# Patient Record
Sex: Female | Born: 1975 | Race: White | Hispanic: No | Marital: Married | Smoking: Never smoker
Health system: Southern US, Community
[De-identification: ages and names within clinical notes are randomized; demographics above are authoritative.]

## PROBLEM LIST (undated history)

## (undated) HISTORY — PX: ABDOMINAL HYSTERECTOMY: SHX81

## (undated) HISTORY — PX: URETHERAL RE-IMPLANTATION: SHX2616

## (undated) HISTORY — PX: KNEE SURGERY: SHX244

---

## 1997-11-23 ENCOUNTER — Encounter: Admission: RE | Admit: 1997-11-23 | Discharge: 1998-02-21 | Payer: Self-pay | Admitting: Gynecology

## 1998-04-05 ENCOUNTER — Encounter: Admission: RE | Admit: 1998-04-05 | Discharge: 1998-07-04 | Payer: Self-pay | Admitting: Gynecology

## 1998-06-13 ENCOUNTER — Inpatient Hospital Stay (HOSPITAL_COMMUNITY): Admission: AD | Admit: 1998-06-13 | Discharge: 1998-06-16 | Payer: Self-pay | Admitting: Gynecology

## 2000-03-27 ENCOUNTER — Encounter: Admission: RE | Admit: 2000-03-27 | Discharge: 2000-06-25 | Payer: Self-pay | Admitting: Gynecology

## 2000-05-29 ENCOUNTER — Inpatient Hospital Stay (HOSPITAL_COMMUNITY): Admission: AD | Admit: 2000-05-29 | Discharge: 2000-05-31 | Payer: Self-pay | Admitting: *Deleted

## 2000-11-20 ENCOUNTER — Encounter: Payer: Self-pay | Admitting: Emergency Medicine

## 2000-11-20 ENCOUNTER — Encounter: Admission: RE | Admit: 2000-11-20 | Discharge: 2000-11-20 | Payer: Self-pay | Admitting: Emergency Medicine

## 2002-06-15 ENCOUNTER — Other Ambulatory Visit: Admission: RE | Admit: 2002-06-15 | Discharge: 2002-06-15 | Payer: Self-pay | Admitting: Gynecology

## 2002-12-12 ENCOUNTER — Observation Stay (HOSPITAL_COMMUNITY): Admission: AD | Admit: 2002-12-12 | Discharge: 2002-12-13 | Payer: Self-pay | Admitting: Gynecology

## 2002-12-12 ENCOUNTER — Encounter: Payer: Self-pay | Admitting: Gynecology

## 2003-01-04 ENCOUNTER — Observation Stay (HOSPITAL_COMMUNITY): Admission: AD | Admit: 2003-01-04 | Discharge: 2003-01-05 | Payer: Self-pay | Admitting: Gynecology

## 2003-01-24 ENCOUNTER — Inpatient Hospital Stay (HOSPITAL_COMMUNITY): Admission: AD | Admit: 2003-01-24 | Discharge: 2003-01-26 | Payer: Self-pay | Admitting: Gynecology

## 2003-03-17 ENCOUNTER — Other Ambulatory Visit: Admission: RE | Admit: 2003-03-17 | Discharge: 2003-03-17 | Payer: Self-pay | Admitting: Gynecology

## 2004-12-13 ENCOUNTER — Other Ambulatory Visit: Admission: RE | Admit: 2004-12-13 | Discharge: 2004-12-13 | Payer: Self-pay | Admitting: Gynecology

## 2005-01-02 ENCOUNTER — Encounter: Admission: RE | Admit: 2005-01-02 | Discharge: 2005-04-02 | Payer: Self-pay | Admitting: Gynecology

## 2006-02-05 ENCOUNTER — Other Ambulatory Visit: Admission: RE | Admit: 2006-02-05 | Discharge: 2006-02-05 | Payer: Self-pay | Admitting: Gynecology

## 2006-02-14 ENCOUNTER — Encounter (INDEPENDENT_AMBULATORY_CARE_PROVIDER_SITE_OTHER): Payer: Self-pay | Admitting: Specialist

## 2006-02-14 ENCOUNTER — Inpatient Hospital Stay (HOSPITAL_COMMUNITY): Admission: RE | Admit: 2006-02-14 | Discharge: 2006-02-18 | Payer: Self-pay | Admitting: Gynecology

## 2006-02-17 ENCOUNTER — Encounter: Payer: Self-pay | Admitting: Urology

## 2006-02-21 ENCOUNTER — Ambulatory Visit (HOSPITAL_COMMUNITY): Admission: RE | Admit: 2006-02-21 | Discharge: 2006-02-21 | Payer: Self-pay | Admitting: Urology

## 2006-03-06 ENCOUNTER — Ambulatory Visit (HOSPITAL_COMMUNITY): Admission: RE | Admit: 2006-03-06 | Discharge: 2006-03-06 | Payer: Self-pay | Admitting: Urology

## 2006-04-03 ENCOUNTER — Inpatient Hospital Stay (HOSPITAL_COMMUNITY): Admission: RE | Admit: 2006-04-03 | Discharge: 2006-04-08 | Payer: Self-pay | Admitting: Urology

## 2006-04-03 ENCOUNTER — Encounter (INDEPENDENT_AMBULATORY_CARE_PROVIDER_SITE_OTHER): Payer: Self-pay | Admitting: Specialist

## 2006-10-17 ENCOUNTER — Ambulatory Visit (HOSPITAL_COMMUNITY): Admission: RE | Admit: 2006-10-17 | Discharge: 2006-10-17 | Payer: Self-pay | Admitting: Urology

## 2007-01-02 ENCOUNTER — Encounter: Admission: RE | Admit: 2007-01-02 | Discharge: 2007-01-02 | Payer: Self-pay | Admitting: Gynecology

## 2007-12-26 IMAGING — CT CT PELVIS W/ CM
1 of 4 series · 12 of 32 positions shown, 18 images · IV contrast (30ML OMNI-MIX & [ID] OMNIP 300%)
Comparison: none

CLINICAL DATA: Status post vaginal hysterectomy.  Right ovary and right fallopian tube were removed.  The patient now has fever.  Evaluate for abscess.
 ABDOMEN CT WITH CONTRAST:
TECHNIQUE: Multidetector CT imaging of the abdomen was performed following the standard protocol during bolus administration of intravenous contrast.
 Contrast:  100 cc Omnipaque 300.
TECHNIQUE: Multidetector CT imaging of the pelvis was performed following the standard protocol during bolus administration of intravenous contrast.

[Series 2: abd pelvis · axial · 0.68mm/px · z∈[-420,-40]mm · 12 of 90 slices shown, 18 images]
[im 7/90  soft-tissue]
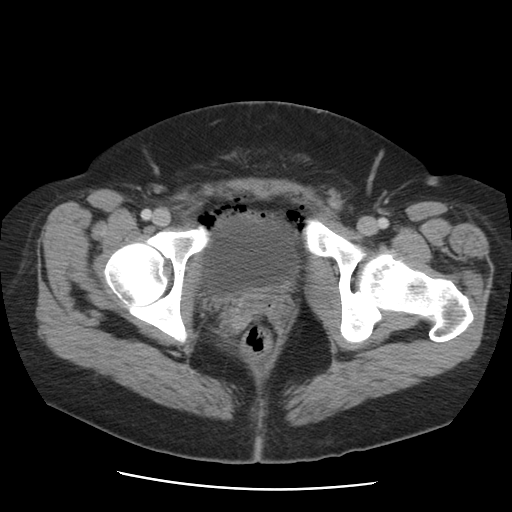
[im 7/90  bone]
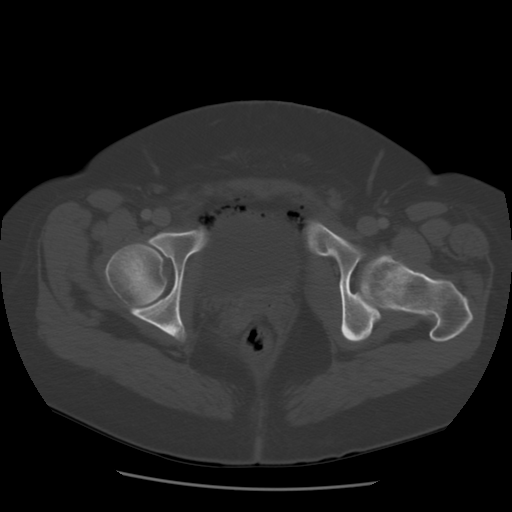
[im 14/90  soft-tissue]
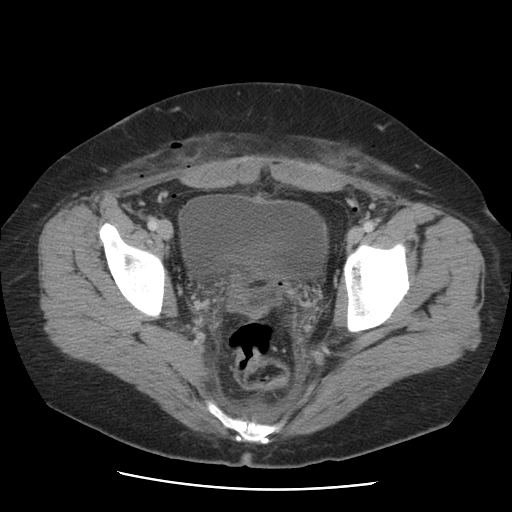
[im 21/90  soft-tissue]
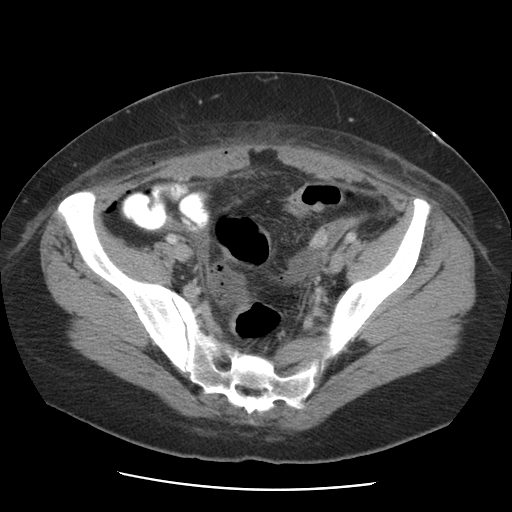
[im 28/90  soft-tissue]
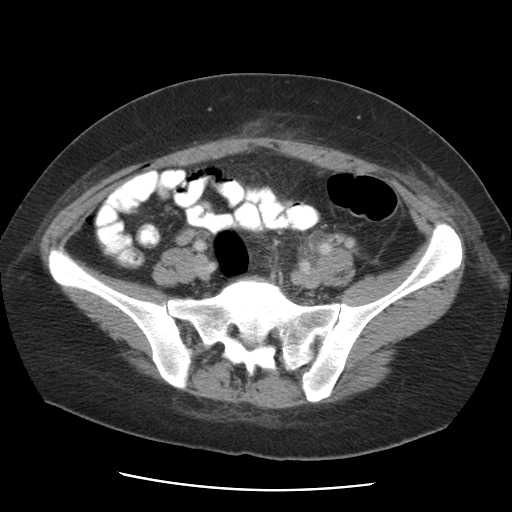
[im 35/90  soft-tissue]
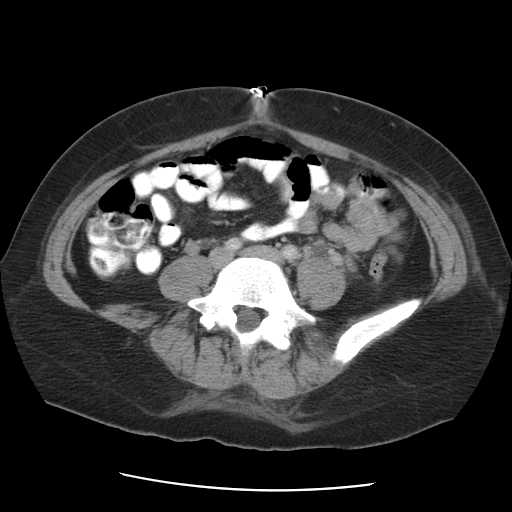
[im 42/90  soft-tissue]
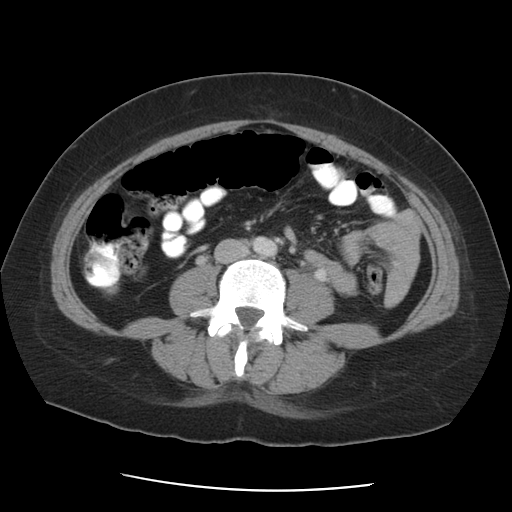
[im 48/90  soft-tissue]
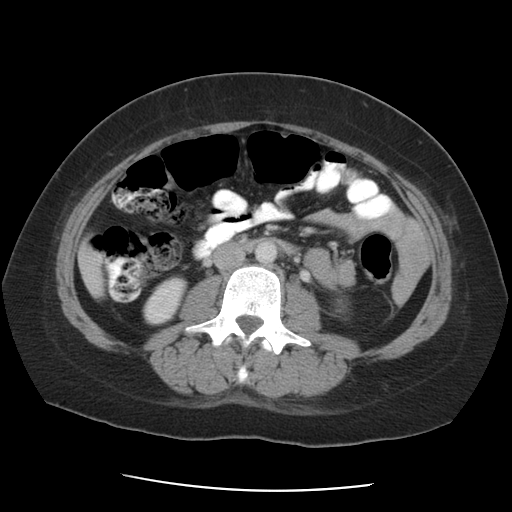
[im 55/90  soft-tissue]
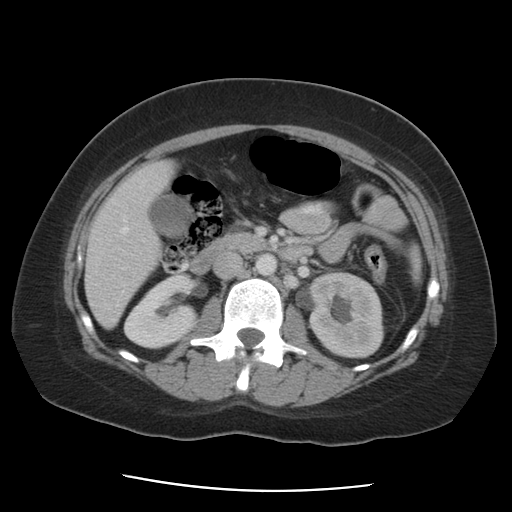
[im 62/90  soft-tissue]
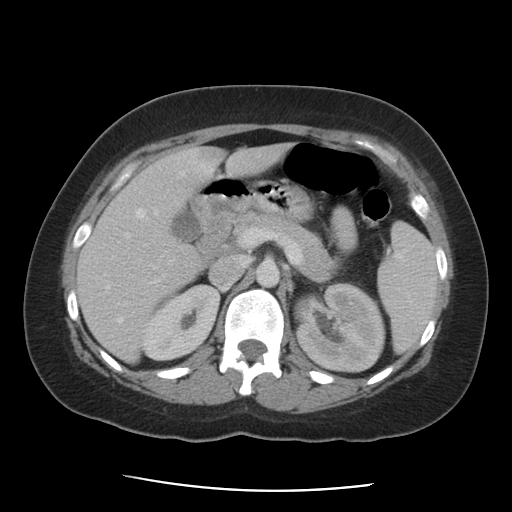
[im 62/90  lung]
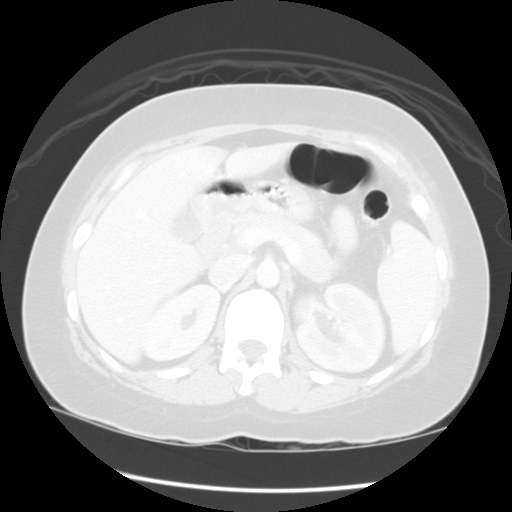
[im 62/90  bone]
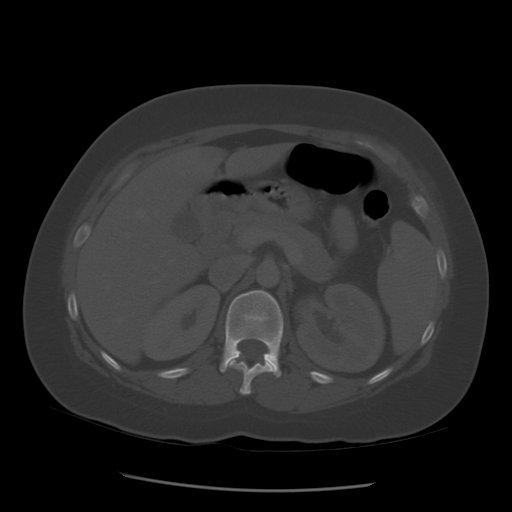
[im 69/90  soft-tissue]
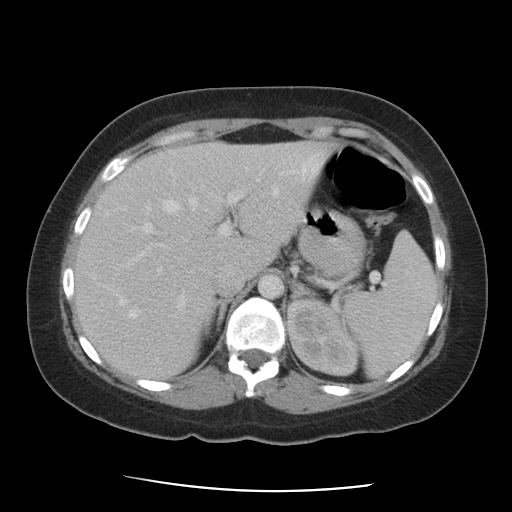
[im 69/90  lung]
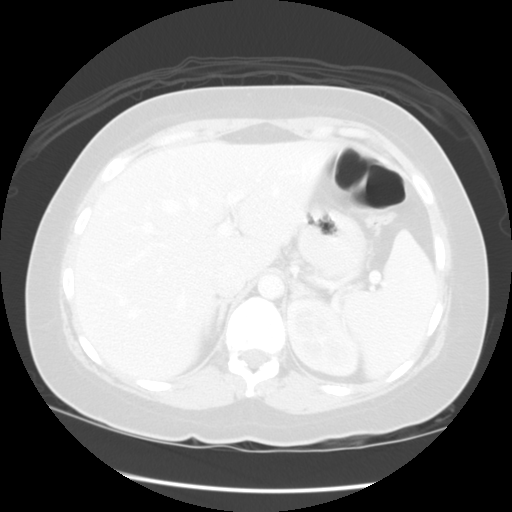
[im 76/90  soft-tissue]
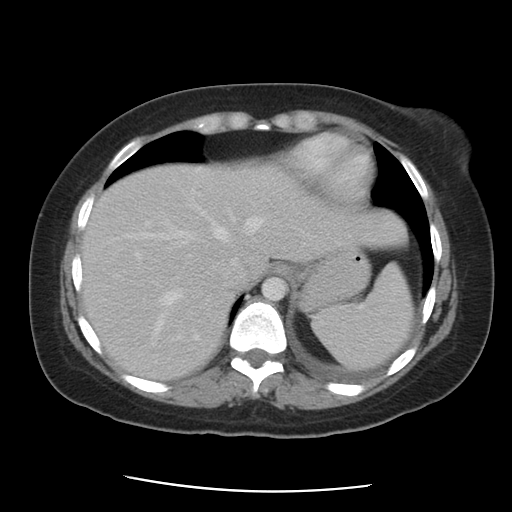
[im 76/90  lung]
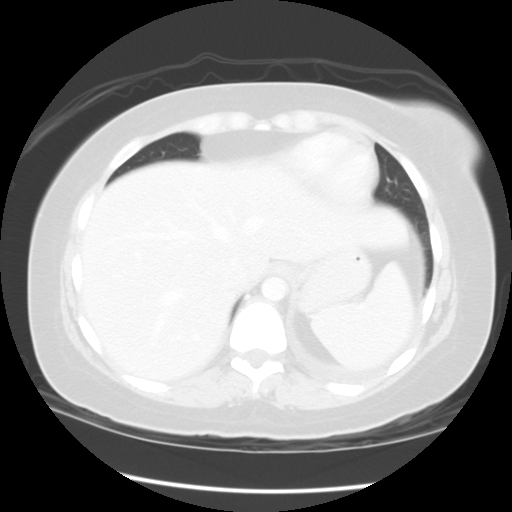
[im 83/90  soft-tissue]
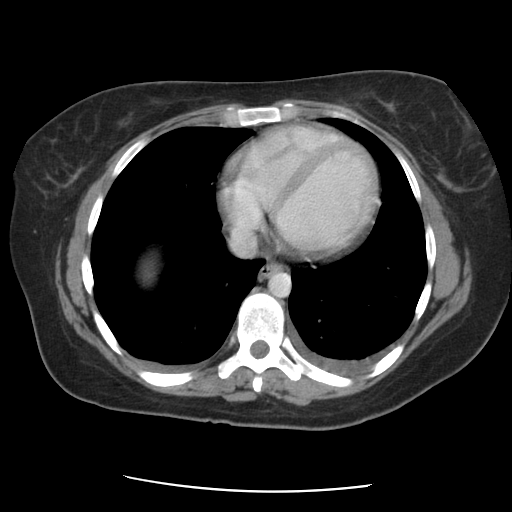
[im 83/90  lung]
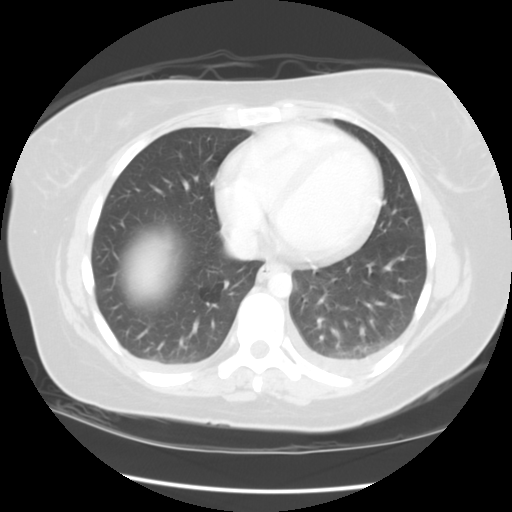

[12 of 32 positions shown; findings below may reference images not displayed]

FINDINGS: Bilateral pleural effusions are noted.  
 The liver is normal in attenuation and morphology.
 The spleen is negative. 
 The adrenal glands are negative. 
 The right kidney is normal.  There is delayed enhancement of the left kidney with moderate hydronephrosis and hydroureter.  On delayed images, there is no radiotracer excretion within the left kidney.  
 There is foci of air within the lower abdominal peritoneal cavity and within the anterior abdominal wall consistent with recent surgery.  
 Review of the bone windows is unremarkable.
IMPRESSION: 1.  Asymmetric delayed enhancement of the left kidney and hydronephrosis.  Findings are concerning for left renal obstruction.
 2.  Bilateral pleural effusions. 
 PELVIS CT WITH CONTRAST:
FINDINGS: Postoperative changes from vaginal hysterectomy are identified.  There is a nonspecific fluid collection anterior to the rectum measuring 7.1 x 1.9 cm (image #72).  This may represent a postoperative abscess.  Hematoma or seroma may have a similar appearance.  Speckled foci of gas favors abscess formation.
 The urinary bladder is negative.  There is a small amount of free fluid within the pelvis. 
 There is hydroureter on the left which extends into the operative bed.  The fluid collection may be obstructing the left ureter.
IMPRESSION: 1.  Nonspecific fluid collection containing speckled areas of gas within the operative bed which may represent abscess.  Hematoma or seroma can have a similar appearance. 
 2.  Left-sided hydroureter which is concerning for left renal obstruction.

## 2007-12-31 IMAGING — XA IR BILIARY CATHETER EXCHANGE
1 series · 5 of 5 positions shown · IV contrast (omnipaque)
Comparison: none

CLINICAL DATA: The patient is status post hysterectomy on 02/14/06.  Percutaneous nephrostomy tube was then placed on 02/17/06 for obstruction of the left kidney and ureter.  The patient now presents for further assessment and possible placement of antegrade ureteral stent. 
 LEFT NEPHROSTOGRAM:
 EXCHANGE OF LEFT PERCUTANEOUS NEPHROSTOMY TUBE:
 Prior to the procedure, informed consent was obtained.  The patient received 400 mg IV Cipro. 
 Sedation:  4 mg IV Versed, 150 mcg IV fentanyl. 
 Total moderate sedation time:  35 minutes. 
 Contrast:  25 cc Omnipaque 300. 
 Fluoro time:  6.9 minutes. 
 The patient was placed in a prone position.  The preexisting left nephrostomy tube was sterilely prepped and draped.  Initially this tube was injected with contrast material, and ureteral patency was evaluated.  
 The nephrostomy tube was removed over a guidewire.  A diagnostic catheter was advanced into the ureter.  Additional contrast injections were then performed in the ureter.  Attempts were then made to advance the diagnostic catheter utilizing various guidewires at the level of ureteral obstruction. 
 A new 10-French nephrostomy tube was then placed and formed.  Pc Gonza style tube was placed in order to allow a smaller pigtail at the level of the renal pelvis.  The catheter was flushed and connected to a gravity drainage bag.  It was secured to the skin with a Prolene retention suture and adhesive retention device.

[Series 1000: run · 5 of 5 slices shown]
[im 1/5]
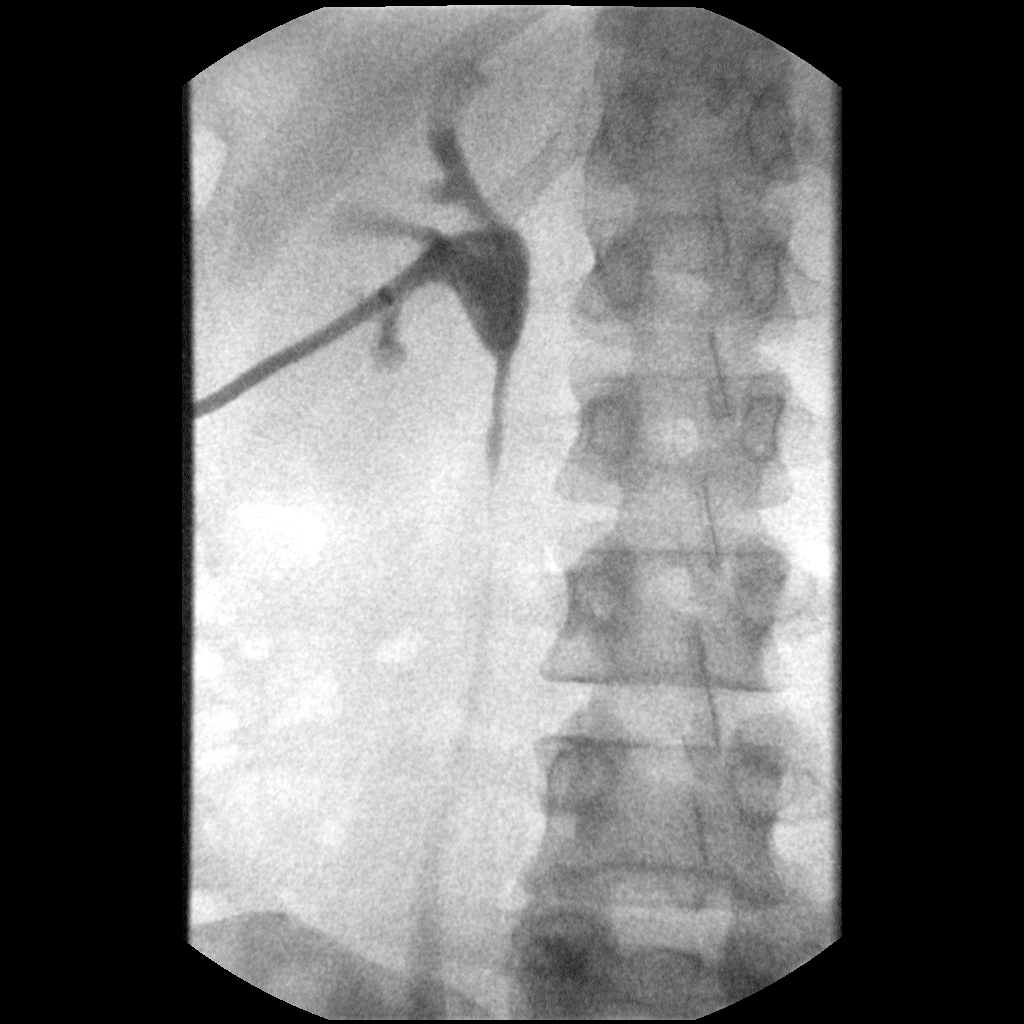
[im 2/5]
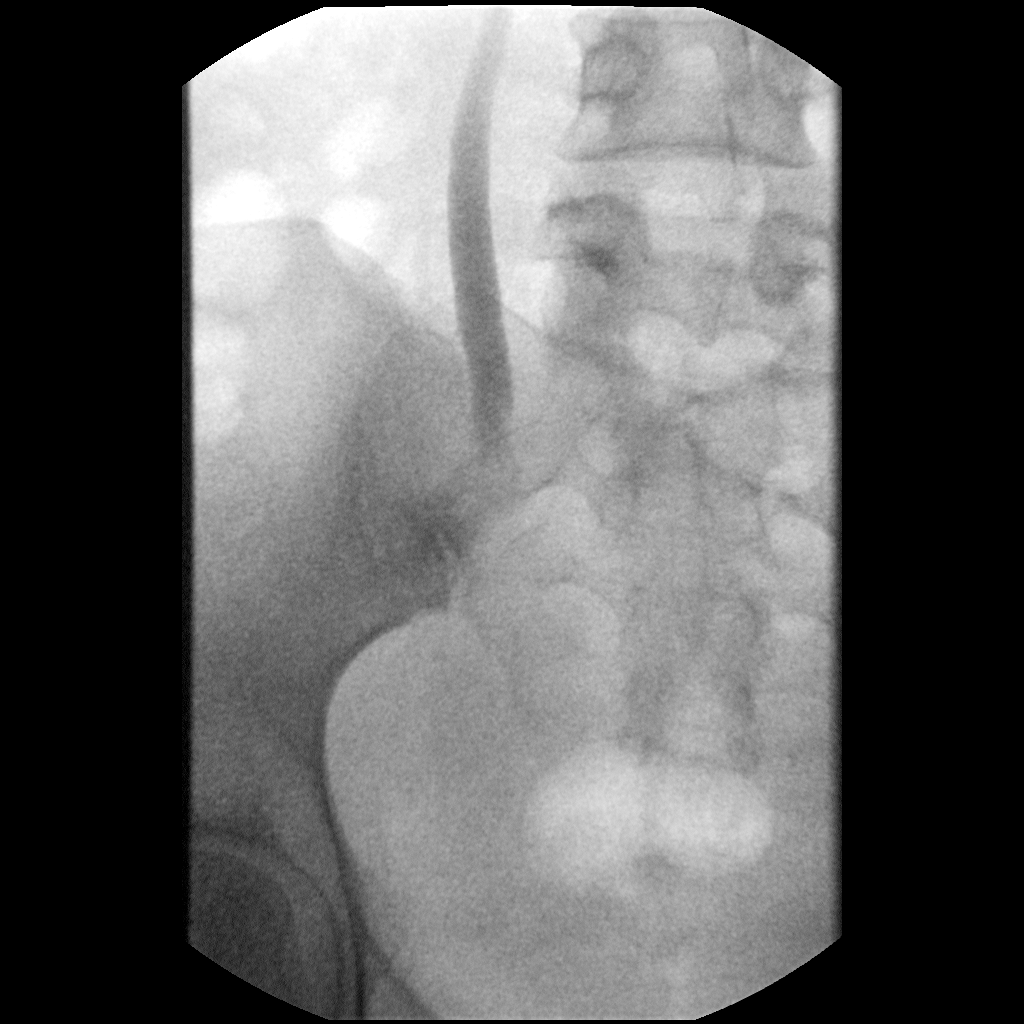
[im 3/5]
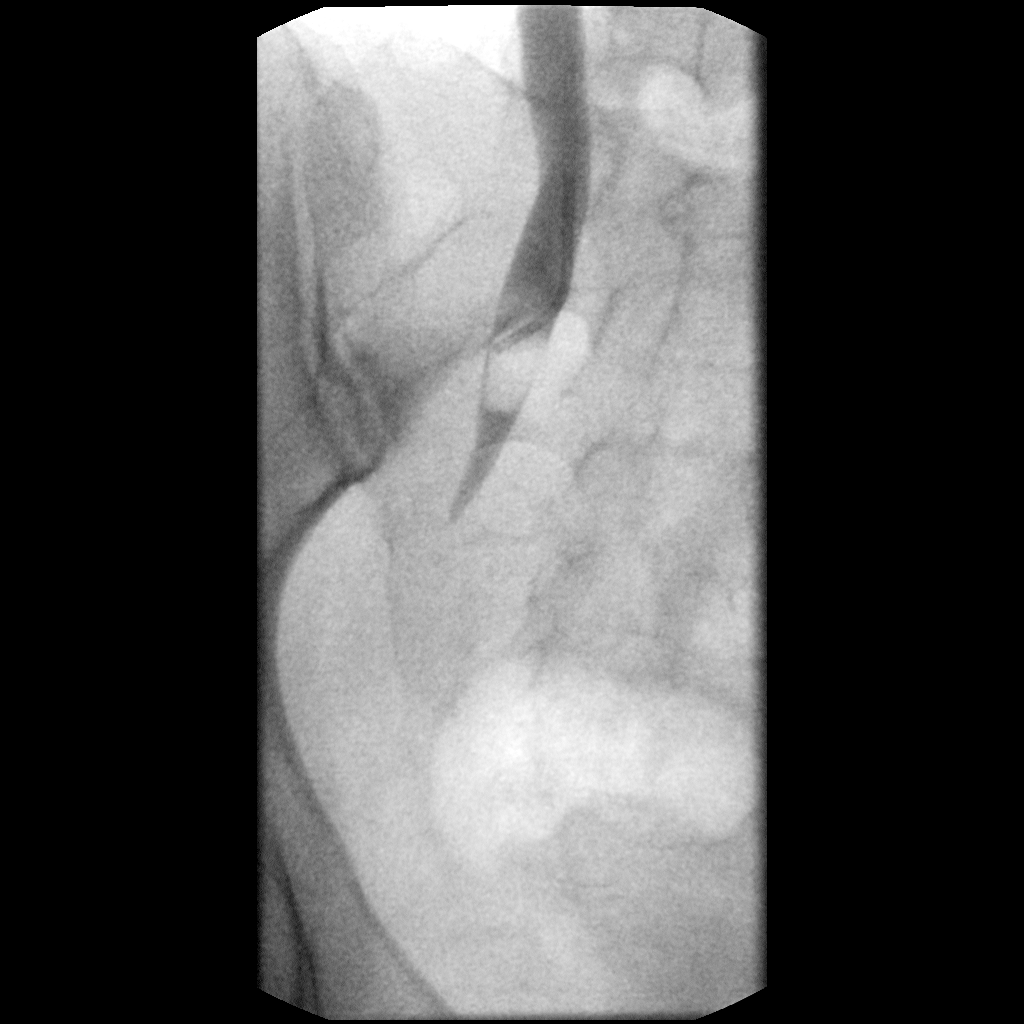
[im 4/5]
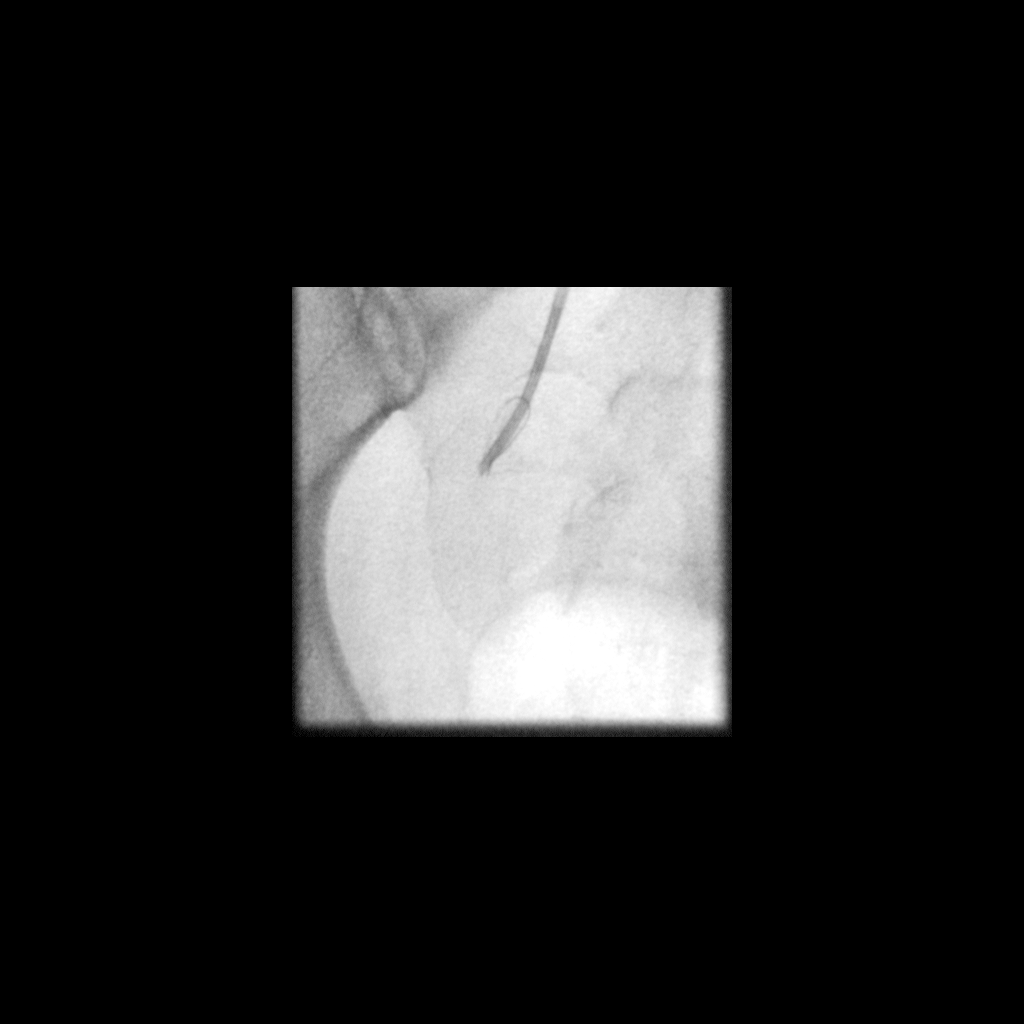
[im 5/5]
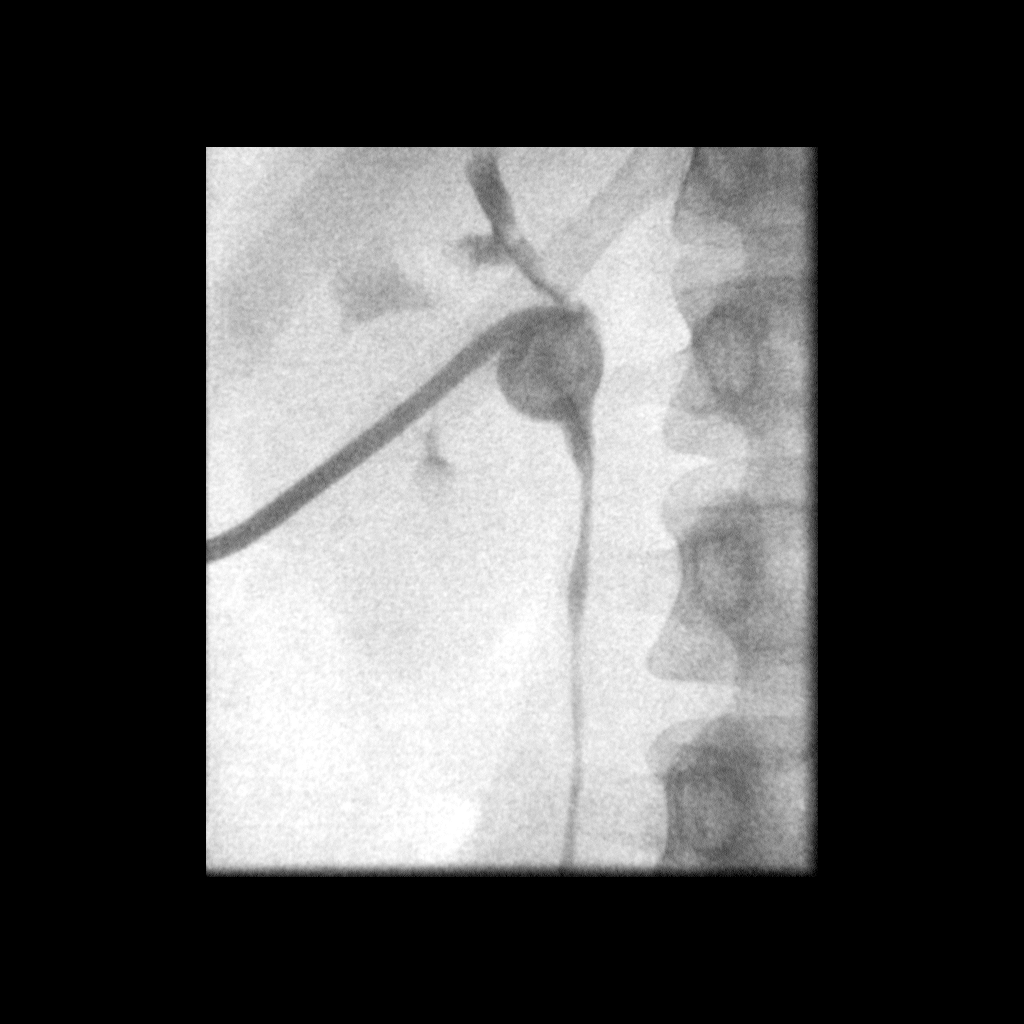

[5 of 5 positions shown; findings below may reference images not displayed]

FINDINGS: Initial contrast injection via the preexisting nephrostomy tube demonstrates a decompressed left renal collecting system and patent proximal ureter.  Additional evaluation of ureteral patency with a diagnostic catheter shows a high-grade obstruction of the distal ureter in the pelvis roughly at the level of the mid sacrum.  At this level, the ureter tapers to a level of obstruction, which could not be crossed despite attempt at traversing the occlusion with various guidewires, including small-caliber guidewires.  A new nephrostomy tube was therefore placed.  The renal pelvis is decompressed and given its size, a smaller pigtail-type tube was placed.  The outer diameter of this tube, however, is still 10-French as was the previously placed nephrostomy tube. This will be left to gravity drainage for approximately the next two weeks, and reattempt will be made to cross the ureteral stricture.
IMPRESSION: Nephrostogram and ureteral contrast injections show a high-grade tapered obstruction of the ureter in the pelvis.  This could not be crossed by any guidewire and, therefore, a ureteral stent could not be placed.  A new nephrostomy tube was placed and formed at the level of the renal pelvis.  Attempt will be performed in approximately two weeks to cross the occlusion.  Findings were discussed directly with Dr. Mee Lin.

## 2010-10-12 ENCOUNTER — Ambulatory Visit (INDEPENDENT_AMBULATORY_CARE_PROVIDER_SITE_OTHER): Payer: BC Managed Care – PPO | Admitting: Gynecology

## 2010-10-12 DIAGNOSIS — N951 Menopausal and female climacteric states: Secondary | ICD-10-CM

## 2010-10-12 DIAGNOSIS — K602 Anal fissure, unspecified: Secondary | ICD-10-CM

## 2010-10-26 NOTE — Discharge Summary (Signed)
   NAME:  April [name], April [name]                            ACCOUNT NO.:  [id number]   MEDICAL RECORD NO.:  [id number]                   PATIENT TYPE:  INP   LOCATION:  9102                                 FACILITY:  [facility]   PHYSICIAN:  Ivor Costa. Farrel Gobble, M.D.              DATE OF BIRTH:  [birth year]   DATE OF ADMISSION:  01/24/2003  DATE OF DISCHARGE:  01/26/2003                                 DISCHARGE SUMMARY   DISCHARGE DIAGNOSES:  1. Intrauterine pregnancy 40 weeks, delivered.  2. Status post spontaneous vaginal delivery.  3. Positive group B Strep.   HISTORY:  This is a 35 year old female gravida 3, para 2 with an EDC of  January 19, 2003.  Prenatal course had been complicated by history of  positive group B Strep.   HOSPITAL COURSE:  On January 24, 2003 patient was admitted 40+ weeks with  gross rupture of membranes.  Was begun on  antibiotics for group B strep  prophylaxis.  Subsequently, on January 25, 2003 underwent a spontaneous  vaginal delivery of a female, Apgars of 8 and 9, weight of 8 pounds 11 ounces.  There was no episiotomy, no significant laceration.  There were small  bilateral periurethral tears which required no sutures.  Postpartum patient  remained afebrile, voiding, stable condition.  She desires of a discharge to  home on postpartum day one on January 26, 2003 and was in satisfactory  condition.   ACCESSORY CLINICAL FINDINGS:  Laboratories:  The patient is A+.  Rubella  immune.  On January 26, 2003 hemoglobin was 11.5.   DISPOSITION:  The patient is discharged to home.  Informed to return to  office in six weeks.  If had any problem prior to that time to be seen in  the office.     Susa Loffler, P.A.                    Ivor Costa. Farrel Gobble, M.D.    TSG/MEDQ  D:  02/18/2003  [name]:  02/18/2003  Job:  [id number]

## 2010-10-26 NOTE — H&P (Signed)
NAME:  April [name], April [name]                  ACCOUNT NO.:  [id number]   MEDICAL RECORD NO.:  [id number]          PATIENT TYPE:  INP   LOCATION:                                FACILITY:  [facility]   PHYSICIAN:  Juan H. Lily Peer, M.D.     DATE OF BIRTH:   DATE OF ADMISSION:  DATE OF DISCHARGE:                                HISTORY & PHYSICAL   CHIEF COMPLAINT:  1. Menorrhagia, dysmenorrhea.  2. Chronic right lower quadrant pain.   HISTORY:  The patient is a 35 year old, gravida 3 para 3, who was seen in  the office for preoperative consultation on August 29.  She is scheduled for  laparoscopic-assisted vaginal hysterectomy with right salpingo-oophorectomy  on Friday, February 14, 2006 at [facility] secondary to menorrhagia,  dysmenorrhea and chronic right lower quadrant pain.  The patient has had  extensive workup to include abdominal and pelvic CT scans which were normal.  She had a small bowel follow through and BE which was normal.  She has had a  colonoscopy with removal of benign polyps and has continued to have the  right lower quadrant pain.  She also has had an ultrasound done in our  office on December 10, 2005 which was essentially normal.  Her sono histogram  also had been unremarkable as well.  In March of this she had a benign  endometrial biopsy.   PAST MEDICAL HISTORY:  She has had 3 normal spontaneous vaginal deliveries.  SHE IS ALLERGIC  TO AMOXICILLIN.  Previous surgeries consist of  tonsillectomy and adenoidectomy.  Her husband has had a vasectomy.   PHYSICAL EXAMINATION:  The patient weighs 163 pounds.  She is 5 feet 1-1/2  inches tall.  Blood pressure 120/82.  HEENT:  Unremarkable.  NECK:  Supple.  Trachea midline.  No carotid bruits.  No thyromegaly.  LUNGS:  Clear to auscultation.  No rhonchi or wheezes.  HEART:  Regular rate and rhythm.  No murmurs or gallops.  RECTAL EXAM:  Done at time of her annual exam on August 29 was normal.  ABDOMEN:  Soft, nontender without  rebound or guarding.  PELVIC:  Bartholin, urethra and Skene's within normal limits.  Vagina and  cervix with no gross lesions on inspection.  The uterus is anteverted,  normal in size and shape and consistency.  Adnexal without any palpable  masses or tenderness.  RECTAL EXAM:  Unremarkable.   ASSESSMENT:  A 35 year old, gravida 3 para 3, with menorrhagia,  dysmenorrhea, and quadrant right lower quadrant.  Extensive workup could  consistent of abdominal and pelvic CT scan.  Colonoscopy as well as upper GI  were normal with the except of small benign colonic polyp that was removed  at the time of colonoscopy.  The patient has had normal pelvic ultrasound  and normal endometrial biopsy.  The patient is scheduled to undergo  laparoscopic-assisted vaginal hysterectomy, right salpingo-oophorectomy.  Working diagnosis is endometriosis and/or pelvic adhesions or adenomyosis.  The patient was counseled as to the risks, benefits and pros and cons of  laparoscopic-assisted vaginal hysterectomy, right salpingo-oophorectomy,  including infection, bleeding, trauma to internal organs and in the event of  any technical difficulty an open laparotomy technique may need to be  utilized.  We also discussed the risk of hemorrhage.  In the event that she  would need blood or blood products there is a potential risk for  anaphylactic reaction, hepatitis and AIDS was discussed.  Also the remote  possibility of removing both ovaries in the event of bleeding or severe  endometriosis.  We also discussed for deep venous thrombosis prophylaxis we  will put PSA stockings, and she will receive prophylactic antibiotics as  well.  Her recent Pap smear on August 29 was normal as well.  The patient is  fully aware of the potential risks of the operation as well as risk of  trauma to internal organs and accepts all the risks.  All her questions were  answered and __________.   PLAN:  The patient is scheduled for  laparoscopic-assisted vaginal  hysterectomy with right salpingo-oophorectomy on Friday, February 14, 2006  at 7:30 a.m. at [facility].      [place] H. Lily Peer, M.D.  Electronically Signed     JHF/MEDQ  D:  02/13/2006  T:  02/13/2006  Job:  [id number]

## 2010-10-26 NOTE — Discharge Summary (Signed)
NAME:  April [name], April [name]                  ACCOUNT NO.:  [id number]   MEDICAL RECORD NO.:  [id number]          PATIENT TYPE:  INP   LOCATION:  9302                          FACILITY:  [facility]   PHYSICIAN:  Juan H. Lily Peer, M.D.DATE OF BIRTH:  [birth year]   DATE OF ADMISSION:  02/14/2006  DATE OF DISCHARGE:  02/18/2006                                 DISCHARGE SUMMARY   TOTAL DAYS HOSPITALIZED:  Four.   HISTORY:  The patient is a 35 year old, gravida 3, para 3, who was taken to  the operating room on February 14, 2006 secondary to chronic right lower  quadrant pain, menorrhagia, and dysmenorrhea.  The patient had a previous  extensive work-up consisting of abdominopelvic CT scans which have been  normal, she had small bowel follow through and BE which had been normal, she  had a colonoscopy with removal of benign polyps, and continued to have right  lower quadrant pain, and the patient was seeking definitive treatment.  She  underwent a laparoscopic-assisted vaginal hysterectomy with right salpingo-  oophorectomy by Dr. Gaetano Hawthorne. Lily Peer and Dr. Douglass Rivers on the morning  of February 14, 2006.  An open exploratory laparotomy had to be performed  after completion of the hysterectomy and closing of the cuff due to  persistent bleeding, for continuing of bleeding pedicles.  See operative  note for details.  The patient had lost 1000 cc of blood during her  procedure.  Postoperatively, her hemoglobin and hematocrit were 11.5 and  28.5 respectively, platelet count of 162,000.  She was passing flatus.  A  comprehensive metabolic panel had been normal, as well.  She had scant blood  in her undergarment, and had adequate urine output in the previous 8 hours.  Her PCA was discontinued, and Foley catheter discontinued that afternoon,  and she was advanced to a full liquid diet and put on oral analgesics.  On  the evening of the first postoperative day, there was concern as temperature  was 100.0.  Her  blood pressure was 110/73.  The patient vomited some bile,  had ambulated in the hall, but complained of complained of needing to void,  and an in and out cath demonstrated 360 cc of clear urine.  She was switched  from Percocet to Stadol for pain relief.  A catheter was reinstituted to  help her evacuate her bladder due to the mild bladder atony.  Her white  blood count was 11.9, hemoglobin 9.8.  Her hemoglobin earlier that morning  had been 9.8, as well, but her white blood cell count had been 19.3.  There  was concern whether she had a postoperative ileus or a __________ cellulitis  or infection, and narcotic sensitivity.  Her serum electrolytes were normal,  as was her BUN and creatinine, as well.  For better coverage, gentamicin had  been added, as well, and a KUB was scheduled for the morning.   On her second postoperative day, she had an adequate urine output and  produced close to 3 liters from the 11 to 7 shift.  Her vital signs were  stable, but her temperature was 100.4.  She was switched to a broader  spectrum antibiotic.  She was placed on Primaxin.  Suspicion for cuff  cellulitis versus atelectasis was entertained and diagnosed at that time,  and her white blood count had decreased from 19.3 to 11.9 to 11.1.  Her  electrolytes, BUN and creatinine continued to be normal, as well.  The plan  was to advance to a regular diet and to continue antibiotic, encourage  incentive spirometry and to ambulate and shower.  The evening of her second  postoperative day, her temperature on the evening shift was 100.1, and this  was when she was switched to Primaxin for broader coverage.  This is the  time it was decided to proceed then with a CT scan to rule out the  possibility of pelvic abscess or ureteral obstruction.  The report was  delayed enhancement of the left kidney with delayed excretion of contrast.  Findings concerning for distal left ureteral obstruction.  Fluid collection  in  the pelvis was nonspecific.  A urological consultation with Dr.  Patsi Sears was established that evening of her second postoperative day, and  she was taken to the operating room where he performed a cystourethroscopy,  left retrograde pyelogram with interpretation, and attempted left  urethroscopic placement of double J catheter, which was no successful.  The  plan was then for the patient to have percutaneous nephrostomy to drain the  kidney on the following day.  The patient continued to have normal BUN and  creatinine.  Her temperature now on her third postoperative day was down to  99.2.  Maximum was 99.2 and was 98.8 on the morning of her third  postoperative day.  Urine output was clear and copious.  Percutaneous  nephrostomy was accomplished on February 17, 2006, but unable to pass a  catheter.  I am still waiting for the inflammation to clear to attempt a  week later to place the catheter.   On the fourth postoperative day, the patient had been afebrile greater than  24 hours.  She was status post placement of a left nephrostomy tube.  She  tolerate a regular diet, was positive for passing positive flatus and bowel  movements, was ambulating, her nephrostomy tube was draining well, her  staples were removed, incisions were Steri-stripped, and she was discharged  home with followup with the radiology department on Friday, February 21, 2006 for attempt at placement of a double J stent, and followup with Dr.  Patsi Sears on February 25, 2006, and with Dr. Lily Peer on March 03, 2006.  Her final pathology report had demonstrated squamous metaplasia of  the cervix, endometrium with no hyperplasia or carcinoma identified, right  fallopian tube unremarkable.  She did have hemorrhagic corpus luteum cyst  with benign follicular cyst on the right, but no evidence of endometriosis  or evidence of malignancy.   FINAL DIAGNOSES:  1. Dysmenorrhea. 2. Menorrhagia.  3. Chronic right  lower quadrant pain.  4. Hemorrhage/anemia.  5. Left ureteral obstruction.  6. Bilateral pleural effusion.  7. Postoperative fever.   PROCEDURES:  1. Laparoscopically-assisted vaginal hysterectomy with right salpingo-      oophorectomy.  2. Exploratory laparotomy (intraoperative hemorrhage, containment of      bleeding pedicles).  3. CT with contrast of abdomen and pelvis.  4. Neurological consultation.  5. Cystourethroscopy by Dr. Patsi Sears.  6. Left retrograde pyelogram with interpretation by Dr. Patsi Sears,      urologist.  7. Left  urethroscopy, attempted left double J catheter by Dr. Patsi Sears.  8. Left percutaneous nephrostomy.   FINAL DISPOSITION AND FOLLOWUP:  The patient's cultures were negative, with  the exception of urine culture had enterococcus, but she had wide spectrum  coverage with her antibiotic that she received.  The plan was for her the  return on Friday, February 21, 2006 to the radiology department for  attempted placement of double J stent once the inflammation and bleeding had  cleared after the percutaneous nephrostomy, and on February 25, 2006,  followup with Dr. Patsi Sears, urologist, and with Dr. Lily Peer on March 03, 2006.  She was given a prescription for Darvocet one p.o. q.4-6h. p.r.n.  pain.  She was given Reglan 10 mg one p.o. q.4-6h. p.r.n. pain.  Of note,  she had received 20 mg of Lasix, which had the mild pleural effusion and  diuresed well.  Her lungs were clear  at the time of discharge.  Full information was provided to the patient and  her husband as to the hemorrhage, the complication that occur  intraoperatively, and all questions have been entertained, and will follow  accordingly with the above recommendations.  All questions were answered,  and will follow accordingly.      Juan H. Lily Peer, M.D.  Electronically Signed     JHF/MEDQ  D:  03/24/2006  T:  03/24/2006  Job:  [id number]

## 2010-10-26 NOTE — H&P (Signed)
   NAME:  April [name], April [name]                            ACCOUNT NO.:  [id number]   MEDICAL RECORD NO.:  [id number]                   PATIENT TYPE:  INP   LOCATION:  9170                                 FACILITY:  [facility]   PHYSICIAN:  Timothy P. Fontaine, M.D.           DATE OF BIRTH:  [birth year]   DATE OF ADMISSION:  01/04/2003  DATE OF DISCHARGE:                                HISTORY & PHYSICAL   CHIEF COMPLAINT:  Contractions.   HISTORY OF PRESENT ILLNESS:  A 35 year old G48 P2 female at [redacted] weeks  gestation who enters with regular contractions, initially thought to be 4 cm  by triage nurse evaluation.  She was admitted for labor and upon admission  to labor and delivery was found to be contracting every three minutes,  although recheck by labor nurse showed that she was 1 cm dilated.  The  patient received Stadol medication for pain and her contractions resolved  over the evening of admission.  Prenatal course is noted for history of GBS  positive with a prior pregnancy and glucose intolerance with her last  pregnancy, although a normal GTT on this current pregnancy.  For the  remainder of her history see her [name].   PHYSICAL EXAMINATION:  HEENT:  Normal.  LUNGS:  Clear.  CARDIAC:  Regular rate without rubs, murmurs, or gallops.  ABDOMEN:  Gravid, vertex fetus.  Reactive fetal tracing on external monitor  with no contractions.  PELVIC:  Shows the patient to be fingertip, 50%, ballotable.   ASSESSMENT:  A 35 year old G67 P2 female at [redacted] weeks gestation, false labor.  Initially found to be contracting; now without contractions.  Cervix has  remained unchanged.  I discussed the situation with the patient.  Her cervix  is not favorable for induction.  Given there are no risk factors with her  pregnancy and the fetus is reassuring on prolonged monitoring, I think the  most prudent course is to go ahead and discharge her home and await  spontaneous labor.  The patient agrees with the plan  and she will be  discharged to follow up in the office the week following discharge.                                               Timothy P. Audie Box, M.D.    TPF/MEDQ  D:  01/05/2003  [name]:  01/05/2003  Job:  [id number]

## 2010-10-26 NOTE — H&P (Signed)
NAME:  April [name], April [name]                  ACCOUNT NO.:  [id number]   MEDICAL RECORD NO.:  [id number]          PATIENT TYPE:  INP   LOCATION:  1412                         FACILITY:  [facility]   PHYSICIAN:  Sigmund I. Patsi Sears, M.D.DATE OF BIRTH:  1976-05-30   DATE OF ADMISSION:  04/03/2006  DATE OF DISCHARGE:                                HISTORY & PHYSICAL   HISTORY OF PRESENT ILLNESS:  Halla is a 35 year old married female, status  post laparoscopically assisted vaginal hysterectomy, complicated by vaginal  cuff bleeding with resulting open hysterectomy, and control of the uterine  artery bleeding from the left side.  Two days following surgery, the patient  underwent CT scanning, which showed obstructive left ureter.  She underwent  cystourethroscopy, left retrograde pyelography that showed complete  obstruction of the left ureter.  There was no contrast proximal to the  obstruction.  The patient then underwent placement of percutaneous  nephrostomy, and eventual antegrade nephrostogram, which showed no contrast  distal to the distal ureter.  No guidewire could be passed antegrade past  the obstruction.  Despite two attempts at this, the patient was then  selected for ureteral implantation via Boari-flap ureteroneocystostomy.   PAST MEDICAL HISTORY:  Noncontributory.   MEDICATIONS:  1. Macrobid 1 per day.  2. Darvocet p.r.n.  3. Hydrocodone p.r.n.  4. Tylenol p.r.n.   ALLERGIES:  AMOXICILLIN ONLY.   PAST SURGICAL HISTORY:  Hysterectomy as noted above.   HABITS:  Tobacco:  None.  Alcohol:  None.   FAMILY HISTORY:  Noncontributory.   REVIEW OF SYSTEMS:  Occasional fever, chills, weight loss, headache,  dizziness, weakness, abdominal discomfort, nausea and vomiting and vaginal  discharge, dyspareunia.   ADMISSION PHYSICAL EXAMINATION:  GENERAL:  Shows a well-developed, well-  nourished female in no acute distress.  VITAL SIGNS:  Blood pressure is 120/87, temperature 99, heart  rate 87,  respiratory rate 18, weight 72.7 kg.  NECK:  Supple, nontender, no nodes.  CHEST:  Clear to auscultation and percussion.  ABDOMEN:  Soft, positive bowel sounds, without organomegaly, without masses.  There is a left percutaneous nephrostomy tube in place.  Pubis shows normal  female external genitalia.  Urethra was in normal position and the bladder  was nonpainful today.  EXTREMITIES:  No cyanosis or edema.  LYMPHATICS:  Nonpalpable.  SKIN:  Normal to inspection and palpation.  PSYCHOLOGIC:  Normal orientation to time, person and place.   IMPRESSION:  Patient stable for operative intervention today and will be  admitted via the operating room.      Sigmund I. Patsi Sears, M.D.  Electronically Signed     SIT/MEDQ  D:  04/03/2006  T:  04/04/2006  Job:  [id number]   cc:   Gaetano Hawthorne. Lily Peer, M.D.  Fax: [phone]

## 2010-10-26 NOTE — Discharge Summary (Signed)
   NAME:  April [name], April [name]                            ACCOUNT NO.:  [id number]   MEDICAL RECORD NO.:  [id number]                   PATIENT TYPE:  OBV   LOCATION:  9157                                 FACILITY:  [facility]   PHYSICIAN:  Timothy P. Fontaine, M.D.           DATE OF BIRTH:  [birth year]   DATE OF ADMISSION:  12/12/2002  DATE OF DISCHARGE:  12/13/2002                                 DISCHARGE SUMMARY   DISCHARGE DIAGNOSES:  1. Intrauterine pregnancy 35 weeks, undelivered.  2. History of fall.   HISTORY:  A 35 year old female gravida 3, para 2 who reported a fall the  evening prior to admission and subsequently had felt vaginal moisture on and  off; denies contractions or abdominal pain.  She did hit her abdomen but  since that time noted clear vaginal leakage, although did note that when she  took her vitamins, they normally colored her urine, but also colored this  discharge.  No other symptoms.   HOSPITAL COURSE:  On December 12, 2002 patient was admitted at 35 weeks with  history of fall and questionable vaginal leakage.  Kleibauer-Betke test was  negative.  Urinalysis was negative except for 40 ketones.  Ultrasound study  revealed normal AFI at 16.  Placenta was right lateral.  No evidence of  abruption on placenta.  Cervix was long and 3.2 cm.  Per nurse practitioner  examination was without gross rupture of membranes, mucocervical discharge,  equivocal nitrazine and fern negative.  The patient was admitted for  prolonged monitoring because she was having some mild irregular  contractions.  It was felt that the vaginal leakage was urine.  On December 13, 2002 patient was without contractions, reactive fetus, no leakage and  patient was felt stable for discharge.  She was to monitor fetal movements  three times a day and she was to follow up in the office the following day.  If she had any problems prior to that time she was to be seen in the office.     Susa Loffler, P.A.                     Timothy P. Audie Box, M.D.    [name]  D:  01/03/2003  [name]:  01/03/2003  Job:  [id number]

## 2010-10-26 NOTE — H&P (Signed)
NAME:  April [name], April [name]                            ACCOUNT NO.:  [id number]   MEDICAL RECORD NO.:  [id number]                   PATIENT TYPE:  OBV   LOCATION:  9157                                 FACILITY:  [facility]   PHYSICIAN:  Timothy P. Fontaine, M.D.           DATE OF BIRTH:  01-Nov-1975   DATE OF ADMISSION:  12/12/2002  DATE OF DISCHARGE:                                HISTORY & PHYSICAL   CHIEF COMPLAINT:  1. Leakage of fluid.  2. Recent fall.   HISTORY OF PRESENT ILLNESS:  [age]-year-old G3, P2 female at [redacted] weeks  gestation, reports a fall the evening before admission and subsequently had  felt some vaginal moisture on and off.  She denies any contractions or  abdominal pain.  No vaginal bleeding or other symptoms.  The patient relates  tripping and falling to her knees.  She did hit her abdomen, although  relates it was not a violent hit but more of a gentle roll when she landed  on her chest.  Since that time, she notes some clear vaginal leakage,  although does note that when she took her vitamin which normally colors her  urine, that it also colored this discharge.  No fevers, chills, or other  constitutional symptoms.  For the remainder of his history, see her  [name].   PHYSICAL EXAMINATION:  HEENT:  Normal.  LUNGS:  Clear.  CARDIAC:  Regular rate without rubs, murmurs, or gallops.  ABDOMEN:  Benign.  Her uterus is soft and nontender.  External monitor shows  reactive fetus with some mild irregular contractions every seven to 10  minutes.  PELVIC:  Per nurse practitioner, is without gross rupture of membranes.  Mucoid cervical discharge.  Equivocal Nitrazine.  Negative fern.   LABORATORY STUDIES:  Kleihauer-Betke is 0.  Urinalysis is negative noting 40  ketones.  Ultrasound study shows a normal AFI at 16 cm.  Placenta is right  lateral.  No evidence of abruption.  No previa.  Cervix is long at 3.2 cm.   ASSESSMENT AND PLAN:  [age]-year-old G3, P2, female,  35 weeks, history  of fall with subsequent vaginal leakage.  Exam is benign.  Ultrasound  negative.  Kleihauer-Betke is negative.  Showed some mild irregular  contractions on external.  Question whether normal 35 week irritability  versus response to her fall.  Would admit for prolonged monitoring.  If  contractions resolve, fetus remains reactive, then will discharge in the  morning.  Vaginal leakage suspicious for urine.  Note - Did change colors  when she took her vitamin which again is consistent with her urine.  Nitrazine was equivocal, although abundant cervical mucus was noted and I  suspect this is a cross reactivity.  The fern was negative and there was no  pooling.  There was adequate AFI.  Again, will monitor through the evening,  reassess in the morning.  If without continued  leakage, then would discharge  home for followup at her planned OB visit the following day after discharge.                                               Timothy P. Audie Box, M.D.    TPF/MEDQ  D:  12/13/2002  [name]:  12/13/2002  Job:  [id number]

## 2010-10-26 NOTE — Consult Note (Signed)
NAME:  [name] [name]                  ACCOUNT NO.:  [id number]   MEDICAL RECORD NO.:  [id number]          PATIENT TYPE:  INP   LOCATION:  9302                          FACILITY:  [facility]   PHYSICIAN:  Sigmund I. Patsi Sears, M.D.DATE OF BIRTH:  04/29/1976   DATE OF CONSULTATION:  02/16/2006  DATE OF DISCHARGE:                                   CONSULTATION   UROLOGY CONSULTATION   DATE OF CONSULTATION:  February 16, 2006 at 11:00 P.M.   HISTORY OF PRESENT ILLNESS:  Patient is a 35 year old female status post  laparoscopically assisted vaginal hysterectomy with complication of pelvic  bleeding necessitating open hysterectomy with over-sewn vaginal cuff on  Friday afternoon, February 14, 2006.  Postoperatively, the patient has  remained stable, although she has had elevated white count of 19,000, with  hemoglobin of 9.8, hematocrit 28.5.  The patient was felt to be poorly  progressing, although she has voided well over the weekend and CT scan of  the abdomen was obtained on Sunday night using oral contrast.  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   Urology is now consulted for left hydronephrosis.   PAST MEDICAL HISTORY:  Gravida 3, para 3.   ALLERGIES:  PENICILLIN.   PAST SURGICAL HISTORY:  1. Tonsillectomy.  2. Adenoidectomy.   HABITS:  Tobacco and alcohol:  None.  ROS: suprapubic pain, nausea, fever. Remaining ROS neg.   PHYSICAL EXAMINATION:  GENERAL APPEARANCE: Well-developed, well-nourished  female in no acute distress.  VITAL SIGNS:  Blood pressure is 106/66, pulse 99, temperature 100.1,  respiratory rate 18, oxygen saturation 93%.  NECK:  Supple, nontender.  CHEST:  Clear to auscultation and percussion.  ABDOMEN:  Obese, decreased bowel sounds, tenderness to palpation over  surgical site. Right greater than left pelvic pain.  GENITOURINARY:  She has normal female external genitalia.  EXTREMITIES:  No cyanosis or edema.  PSYCHOLOGIC:  Normal orientation to time, person and place.   ASSESSMENT:  X-rays are reviewed with Dr. Lily Peer, case discussed with the  patient.  I have discussed the case with anesthesia.  We will plan  cystoscopy, left retrograde pyelogram, double J catheter tonight.  Note: Her  creatinine is 1.2.      Sigmund I. Patsi Sears, M.D.  Electronically Signed     SIT/MEDQ  D:  02/16/2006  T:  02/17/2006  Job:  [id number]

## 2010-10-26 NOTE — Discharge Summary (Signed)
NAME:  April [name], April [name]                  ACCOUNT NO.:  [id number]   MEDICAL RECORD NO.:  [id number]          PATIENT TYPE:  INP   LOCATION:  1412                         FACILITY:  [facility]   PHYSICIAN:  Sigmund I. Patsi Sears, M.D.DATE OF BIRTH:  Aug 07, 1975   DATE OF ADMISSION:  04/03/2006  DATE OF DISCHARGE:  04/08/2006                                 DISCHARGE SUMMARY   OPERATIONS THIS ADMISSION:  Left ureteroneocystostomy via Boari flap and  psoas hitch.   HISTORY OF PRESENT ILLNESS:  April [name] is a 35 year old married female status  post laparoscopically assisted vaginal hysterectomy complicated by vaginal  cuff bleeding. This resulted in open hysterectomy with control of left  uterine artery bleeding with suture technique. Two days following the  surgery the patient underwent CT scanning which showed obstructive left  ureter. She underwent cystourethroscopy, left retrograde pyelography which  showed complete obstruction of the left ureter. No contrast. Would proceed  proximal to the obstruction. The patient underwent placement of a  percutaneous nephrostomy with antegrade nephrostogram, which showed no  contrast distal to the obstructed site. No guidewire could be passed either  antegrade or retrograde through the strictured area, despite multiple  attempts. The patient was then selected for ureteral reimplantation via  psoas hitch and Boari flap, ureteroneocystostomy.   PAST MEDICAL HISTORY:  Noncontributory.   ADMISSION MEDICATIONS:  1. Macrobid one per day.  2. Darvocet p.r.n.  3. Hydrocodone p.r.n.  4. Tylenol p.r.n.   ALLERGIES:  AMOXICILLIN only.   PAST SURGICAL HISTORY:  Significant for hysterectomy as above.   HABITS:  Tobacco none, alcohol none.   FAMILY HISTORY:  Noncontributory.   REVIEW OF SYSTEMS:  1. Occasional fever.  2. Occasional chills.  3. Weight loss.  4. Headache.  5. Dizziness.  6. Weakness.  7. Abdominal discomfort.  8. Nausea, vomiting.  9.  Dyspareunia.   ADMISSION PHYSICAL EXAMINATION:  GENERAL:  A well-developed, well-nourished  female in no acute distress.  VITAL SIGNS:  Blood pressure 120/87, pulse 99, heart rate 87, respiratory  rate 18, weight 72.7 kg.  Admitting physical examination as noted in the dictated H&P of April 03, 2006.   ADMISSION LABORATORY DATA:  Hemoglobin 12.4 with discharge hemoglobin 9.9,  admission hematocrit 36.8 with discharge hematocrit 29, discharge white  blood cell count 10,000, discharge platelet count 175,000. Routine  chemistries have showed sodium 139, potassium 3.5, chloride 104, CO2 27, BUN  2, creatinine 0.6. Random glucose 121 (note elevation). Additional  laboratories in the hospital show JP drainage at 0.7 (consistent with  serum).   HOSPITAL COURSE:  On the day of admission the patient underwent surgical  repair of her obstructive left ureter with a left-sided psoas hitch and  Boari flap ureteroneocystostomy with internal double J catheter placement.  Postoperatively the patient has minimal Blake drainage. This drain was  removed. She had nephrostomy which showed no extravasation and excellent  continuity of her repair. Her nephrostomy tube was removed. The patient had  severe right upper quadrant and right expiratory chest pain on October 29.  Despite negative Homan's sign and  normal O2 saturations of 96%, she  underwent chest CT scanning with contrast which showed normal vasculature  and no evidence of a PE. Her pain went away __________  bowel movement on  October 30. The patient is now ready for discharge. Will be discharged home  with Foley catheter in place for a total of 10 days postoperatively with  left internal double J catheter to remain for six weeks. 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 She has been placed on alternate hormone therapy.   DISCHARGE MEDICATIONS:  1. __________ 100 mg one per day.  2.  Climara 0.1 mg patch one per day.  3. Prozac 10 mg one p.o. per day.  4. Percocet 5/325 mg one p.o. per day.   She will return in one week for staple removal as well as cystogram prior to  Foley catheter removal.  Discharged in good condition.      Sigmund I. Patsi Sears, M.D.  Electronically Signed     SIT/MEDQ  D:  04/08/2006  T:  04/08/2006  Job:  [id number]   cc:   Gaetano Hawthorne. Lily Peer, M.D.  Fax: [phone]

## 2010-10-26 NOTE — Discharge Summary (Signed)
[facility]  Patient:    April [name], April [name]                         MRN: [id number] Adm. Date:  [id number] Disc. Date: [id number] Attending:  Wetzel Bjornstad Dictator:   Antony Contras, [facility]                           Discharge Summary  DISCHARGE DIAGNOSES:          1. Intrauterine pregnancy at term.                               2. History of positive GBS.  PROCEDURES:                   Normal spontaneous vaginal delivery over                               intact perineum without lacerations.  HISTORY OF PRESENT ILLNESS:   Patient is a 35 year old gravida 2, para 1-0-0-1 with an LMP of August 27, 1999, [facility] June 02, 2000. Prenatal risk factors include history of positive GBS, also history of glucose intolerance; patient had an abnormal one-hour with a normal three-hour GTT.  PRENATAL LABORATORY DATA:     Blood type A positive, antibody screen negative. RPR, HBSAG, and HIV nonreactive. Rubella immune. MSAFP normal. GBS positive.  HOSPITAL COURSE/TREATMENT:    Patient was admitted on May 29, 2000 at 39 weeks with q.2-4 minute contractions, cervix was 5 cm, 80%, 0 station. She was admitted for labor, GBS prophylaxis, and did progress to complete dilatation and delivered an Apgar 5/9 female infant weighing 7 pounds 12 ounces over intact perineum without laceration.  POSTPARTUM COURSE:            She remained afebrile, had no difficulty voiding, and was able to be discharged on her second postpartum day in satisfactory condition. CBC: hematocrit 36.3, hemoglobin 12.2, WBCs 14.4, platelets 163.  DISPOSITION:                  Follow up in six weeks in the office. Continue prenatal vitamins and iron, Motrin/Tylox for pain. DD:  06/23/00 TD:  06/23/00 Job: [id number]

## 2010-10-26 NOTE — Discharge Summary (Signed)
   NAME:  April [name], April [name]                            ACCOUNT NO.:  [id number]   MEDICAL RECORD NO.:  [id number]                   PATIENT TYPE:  OBV   LOCATION:  9170                                 FACILITY:  [facility]   PHYSICIAN:  Timothy P. Fontaine, M.D.           DATE OF BIRTH:  [birth year]   DATE OF ADMISSION:  01/04/2003  DATE OF DISCHARGE:  01/05/2003                                 DISCHARGE SUMMARY   DISCHARGE DIAGNOSIS:  1. Intrauterine pregnancy at 38 weeks, undelivered.  2. History of positive group B strep in a prior pregnancy.  3. Contractions.   HISTORY OF PRESENT ILLNESS:  This is a 35 year old female, gravida 3, para  2, who was initially admitted through triage for contractions.  Her prenatal  course has been complicated with a history of group B strep and glucose  intolerance with her last pregnancy.  Normal GTT on the current pregnancy.   HOSPITAL COURSE:  On January 04, 2003, the patient was admitted at 38 weeks  with regular contractions, initially thought to be 4 cm by triage nurse  evaluation, admitted for labor and upon admission to labor and delivery was  found to be contracting every three minutes.  On a recheck by labor nurse  showed she was only dilated 1 cm.  She received Stadol medication for pain  and contraction resolved. On exam by M.D., cervix was fingertip, 50%  ballottable, lochia was staple throughout the night.  The situation was  discussed with the patient.  Cervix was not favorable for induction.  Since  there were no risk factors with her pregnancy and the fetus was reassuring  on prolonged monitoring we felt the most prudent course was to do ahead and  discharge her to home and await spontaneous labor.  The patient agreed with  that plan, and she is to follow up in the office next week for evaluation.     Susa Loffler, P.A.                    Timothy P. Fontaine, M.D.    TSG/MEDQ  D:  01/21/2003  [name]:  01/22/2003  Job:  [id number]

## 2010-10-26 NOTE — Op Note (Signed)
NAME:  April [name], April [name]                  ACCOUNT NO.:  [id number]   MEDICAL RECORD NO.:  [id number]          PATIENT TYPE:  INP   LOCATION:  9302                          FACILITY:  [facility]   PHYSICIAN:  Sigmund I. Patsi Sears, M.D.DATE OF BIRTH:  12/26/1975   DATE OF PROCEDURE:  02/17/2006  DATE OF DISCHARGE:                                 OPERATIVE REPORT   PREOPERATIVE DIAGNOSIS:  Left lower ureteral obstruction.   POSTOPERATIVE DIAGNOSIS:  Left lower ureteral obstruction.   OPERATION:  1. Cystourethroscopy.  2. Left retrograde pyelogram with interpretation.  3. Left ureteroscopy, attempted left double J catheter.   SURGEON:  Sigmund I. Patsi Sears, M.D.   ANESTHESIA:  General endotracheal.   PREPARATION:  After appropriate pre-anesthesia, the patient was brought to  the operating room, placed on the operative table in the dorsal supine  position where general endotracheal anesthesia was introduced.  The patient  was placed in the dorsal lithotomy position and the pubis was prepped with  Betadine solution and draped in the usual fashion.   HISTORY:  This 35 year old female was two days status post laparoscopic  assisted hysterectomy, complicated by vaginal cuff bleeding, and open  hysterectomy with bleeding from the uterine artery.  Patient progressed  slowly over the weekend, and CT scan was obtained on Sunday night showing  complete left ureteral obstruction in the mid to lower third ureter.  The  patient is brought to the operating room for a cystoscopy and left double J  catheter placement.   PROCEDURE:  Cystourethroscopy was accomplished revealing a normal appearing  bladder.  The left ureteral orifice was identified.  Indigo carmine was  given but no blue contrast was seen from the left side.  Retrograde  pyelogram was performed, and there was complete obstruction of the left  ureter in the lower third portion.  A glide wire was attempted to be passed  but continually coils at  the level of obstruction.  The 30 degree  ureteroscope is passed into the ureter, to the level of obstruction.  There  is no intraureteral abnormality.  It is felt that there is  external compression.  However, glide wire cannot be passed across the  obstructed segment.  The ureteroscope was therefore removed, Foley catheter  was placed with 10 mL in the balloon.  The patient is awakened, taken to  Recovery Room in good condition.      Sigmund I. Patsi Sears, M.D.  Electronically Signed     SIT/MEDQ  D:  02/17/2006  T:  02/17/2006  Job:  [id number]   cc:   Gaetano Hawthorne. Lily Peer, M.D.  Fax: [phone]

## 2010-10-26 NOTE — Op Note (Signed)
NAME:  April [name], April [name]                  ACCOUNT NO.:  [id number]   MEDICAL RECORD NO.:  [id number]          PATIENT TYPE:  INP   LOCATION:  9302                          FACILITY:  [facility]   PHYSICIAN:  Juan H. Lily Peer, M.D.DATE OF BIRTH:  January 16, 1976   DATE OF PROCEDURE:  02/14/2006  DATE OF DISCHARGE:                                 OPERATIVE REPORT   SURGEON:  Juan H. Lily Peer, M.D.   FIRST ASSISTANT:  Ivor Costa. Farrel Gobble, M.D.   INDICATIONS FOR OPERATION:  A 35 year old gravida 3, para 3 with  menorrhagia, dysmenorrhea, and chronic right lower quadrant pain.   PREOPERATIVE DIAGNOSIS:  1. Menorrhagia.  2. Dysmenorrhea.  3. Chronic right lower quadrant pain, pelvic pain.   POSTOPERATIVE DIAGNOSIS:  1. Menorrhagia.  2. Dysmenorrhea.  3. Chronic right lower quadrant pain, pelvic pain.   ANESTHESIA:  General endotracheal anesthesia.   PROCEDURES PERFORMED:  1. Laparoscopic-assisted vaginal hysterectomy with right salpingo-      oophorectomy.  2. Emergency exploratory laparotomy containment of bleeding cuff and      pedicles.   FINDINGS:  The patient had a normal sized uterus with normal-appearing tubes  and ovaries bilateral; and no evidence of adhesions or endometriosis.   DESCRIPTION OF OPERATION:  After the patient was adequately counseled, she  was taken to the operating room where she underwent a successful general  endotracheal anesthesia.  Since the patient is allergic to PENICILLIN; she  had received clindamycin 900 mg IV.  PSA stockings were utilized for DVT  prophylaxis.  She was placed in the low lithotomy position.  Foley catheter  was in place for monitoring the patient's urinary output.  The abdomen was  prepped and draped in the usual sterile fashion.  A Hulka tenaculum had been  placed for ablation during the laparoscopic portion of her surgery, after  bimanual examination demonstrated and anteverted uterus with no palpable  adnexal masses.   After the drapes  were in place, a small stab incision was made in the  infraumbilical region followed by insertion of the Veress needle.  Opening  intra-abdominal pressure was 5 mmHg and 3.5 liters of carbon dioxide were  then insufflated into the peritoneal cavity.  The Veress needle was removed.  A 10-mm trocar was inserted; and two additional 5-mm ports were placed  approximately 4-5 fingerbreadths from the abdominal median line under  laparoscopic guidance.  In a systematic fashion, the anterior and posterior  cul-de-sac were inspected.  There was some blood in the vaginal vault.  At  the time of her period both adnexa appeared to be normal tubes and ovary.  The right adnexa was inspected.  The right utero-ovarian ligament was placed  under traction and the ovary was elevated and the course of the right ureter  was identified, and the right infundibulopelvic ligament was cauterized; and  transected with a gyrus tripolar unit.  The remainder of the round ligament  and broad and cardinal ligaments were serially clamped, cauterized, and  transected with the tripolar unit to the level of the uterine arteries; and  the bladder flap  was established.  On the contralateral side since left  ovarian conservation was to be undertaken, the left utero-ovarian ligament  and proximal tube were cauterized and transected as well as the round  ligament.  Hugging the uterus and leaving the left tube and ovary in place.  The remaining broad and cardinal ligaments were clamped, cauterized, and  transected with the tripolar unit to the level of the uterine artery and the  bladder flap was then established.  The second portion of the operation  consisted of the vaginal hysterectomy portion.   The patient was then placed in high lithotomy position.  A short weighted  billed speculum was placed in the posterior vaginal vault.  Two Lahey clamps  were used to grasp the anterior and posterior cervical lip respectively; and  2%  Xylocaine with 1:100,000 epinephrine was infiltrated into the cervical  vaginal fold at the 2, 4, 8, and 10 o'clock positions for a total 10 mL.  Following this, a circumferential incision was made into the cervicovaginal  fold.  A posterior colpotomy was established; and the long weighted billed  speculum was then placed into the posterior cul-de-sac; and each respective  uterosacral ligaments were clamped, cut, and suture ligated with #0 Vicryl  suture.  Serial clamping of the remaining cardinal and broad ligaments were  clamped, cut, and suture ligated with #0 Vicryl suture.  Anterior colpotomy  was established after peeling off the bladder, without any difficulty; and  the remaining pedicles were clamped, cut, and suture ligated with #0 Vicryl  suture; and the cervix, uterus, and right tube and ovary were passed off the  operative field.  The vaginal cuff was then closed; the posterior aspect  with a running locking stitch of #0 Vicryl suture; and the remainder of the  cuff was closed with interrupted sutures of #0 Vicryl suture.  It was at  this time that bleeding was noted to be coming copiously from the vagina.  At first it was thought that it was the vaginal cuff, so the vaginal cuff  was closed immediately; and inspection, laparoscopically, demonstrated a  bleeding pedicle, profusely, and making it difficult to safely cauterize.   An emergency laparotomy was made.  Incision was made 2 cm above the  symphysis pubis.  Incision was carried down from the skin, subcutaneous  tissue, down to the rectus fascia whereby a midline nick was made.  The  fascia was incised in a transverse fashion.  The peritoneal cavity was  entered cautiously.  The O'Connor-O'Sullivan retractor was placed.  The  patient was placed in Trendelenburg position.  Pelvic cavity was copiously  irrigated with normal saline solution.  Inspection demonstrated a bleeding edge of the vaginal cuff which was suture ligated  with locking stitch of 3-0  Vicryl suture and a raw area that was bleeding from the left ovarian pedicle  which was secured with 3-0 Vicryl suture as well.  Another small area in the  right angle of the vaginal cuff was bleeding; and was secured with 3-0  Vicryl suture as well.  Of note, prior to these sutures being placed the  retroperitoneal space had identified each respective ureters.  The pelvic  cavity was then copiously irrigated with normal saline solution; and  Surgicel was placed for additional hemostasis.  Sponge count and needle  count were correct.  After copious irrigation, the visceral peritoneum was  closed with a running stitch of 3-0 Vicryl suture.  The rectus fascia was  closed with  a running stitch of #0 Vicryl suture.  The subcutaneous bleeders  were Bovie cauterized and the skin was reapproximated with skin clips.  The  subumbilical incision and two 5-mm ports where the skin was closed with  staple.  Of note, the abdominal 10-mm trocar site, the fascia was closed  with a figure-of-eight of #0 Vicryl suture.  The patient was extubated and  transferred to recovery room.  She was given an additional 80 mg of  gentamicin for prophylaxis.  Her blood loss was 1000 mL, IV fluids consisted  of 3500 mL of lactated Ringer.  Urine output 500 mL and clear.     Juan H. Lily Peer, M.D.  Electronically Signed    JHF/MEDQ  D:  02/14/2006  T:  02/15/2006  Job:  [id number]

## 2010-10-26 NOTE — Op Note (Signed)
NAME:  April [name], April [name]                  ACCOUNT NO.:  [id number]   MEDICAL RECORD NO.:  [id number]          PATIENT TYPE:  INP   LOCATION:  1412                         FACILITY:  [facility]   PHYSICIAN:  Sigmund I. Patsi Sears, M.D.DATE OF BIRTH:  13-Oct-1975   DATE OF PROCEDURE:  04/03/2006  DATE OF DISCHARGE:                                 OPERATIVE REPORT   PREOPERATIVE DIAGNOSIS:  Complete left ureteral obstruction.   POSTOPERATIVE DIAGNOSIS:  Complete left ureteral obstruction plus enlarged  hemorrhagic ovarian infarction.   OPERATIONS:  Suprapubic exploration, excision of enlarged left ovary, left  ureteral reimplantation with Boari flap.   SURGEON:  Sigmund I. Patsi Sears, M.D.   ASSISTANT:  Lindaann Slough, M.D.   PREPARATION:  After appropriate preanesthesia, the patient was brought to  the operating room and placed on the operating table in the dorsal supine  position where general endotracheal anesthesia was induced.  She was then  replaced in the left partial sling physician, with a bump under the left  pelvis.  The abdomen was prepped and draped in the usual fashion.  Foley  catheter was placed.  Nephrostomy tube was placed with anesthesia.   REVIEW OF HISTORY:  This is a 35 year old female, who is status post  laparoscopic-assisted hysterectomy, complicated by vaginal cuff bleeding,  with open hysterectomy, and control of uterine artery bleeding.  Postop CT  scanning 2 days postoperatively showed an obstructed left ureter.  A left  retrograde pyelogram showed complete obstruction of the left ureter, and  double-J catheter could not be passed either from below, or antegrade.  The  nephrostomy tube was left in place.  The patient is now for ureteral  reimplantation.   PROCEDURE:  The previously made Pfannenstiel incision is incised and  extended both on the right side, and on the left side via hockey-stick-type  incision.  Subcutaneous tissue is dissected with the  electrosurgical unit.  Muscle fibers were identified and dissected with the electrosurgical unit.  Previous scar was incised with the electrosurgical unit.   Care was taken to avoid any injury to the peritoneum, and a small opening in  the peritoneum was closed with a single 3-0 Vicryl suture.   The bladder was identified, and the left ureter was identified.  The left  internal iliac artery was identified, as well as the gluteal branch, and the  superior vesicle artery.  These were left intact.  The left ureter was  dissected distalward, and appeared to be transected, and obliterated.  The  ureter was dissected as distalward as possible, clipped and incised.  No  urine was forthcoming.  The end of the ureter was then amputated, and a  normal ureter was identified.  The end of the ureter was sent to the  laboratory for evaluation. A large 4 cm mass was identified in association  with the end of the ureter. The mass was firm, rounded and filled with soft,  gelataneous-feeling clot. The round ligament was identified, and was draped  over the top of the mass, which was thought to be the left ovary. The round  ligament was incised, and the rounded mass was dissected and removed. Frozen-  section showed hemmorrhagic, infarcted ovary. Removal of the mass allowed  the bladder to be streatched close to the ureter, with psoas hitch  acclomplished.   The angled Potts-Smith scissors were used to incise the ureter, in the  antimesenteric portion.   A marking pen was then used to outline the Boari flap, and 4 separate  sutures were placed to outline the borders of the flap.  Using the  electrosurgical unit, the flap was then created with a 4-cm base, and a 4-cm  length.  The flap was then dissected and brought into the left pelvis.  The  ureter was then placed in the flap, sutured to the flap wall.  Using the  angled Potts-Smith scissors, incision was made, and then the ureter was  sutured to the  ureteral wall.  The anchor sutures were sutured to the ureter  and to the bladder.   Following this, no bleeding was noted.  Using a running and interrupted  suture, the flap was closed over the ureter.  The bladder was then closed  with running 3-0 Vicryl suture.  Testing with a water revealed 1 area of  leakage and this was sutured closed with a simple 3-0 Vicryl suture.  The  bladder was further mobilized, and a flap of fatty tissue was then closed  over the repair.  A Blake drain was placed through a separate left lower quadrant stab  incision, and was sutured in place with 3-0 nylon suture.  The wound was  closed with #1 PDS suture.  The patient had Marcaine injection into the  wound edges, as well as a Marcaine pump placement.  Following this, she was  awakened, and taken to recovery room in good condition.      Sigmund I. Patsi Sears, M.D.  Electronically Signed     SIT/MEDQ  D:  04/03/2006  T:  04/04/2006  Job:  [id number]   cc:   Gaetano Hawthorne. Lily Peer, M.D.  Fax: [phone]

## 2011-03-28 ENCOUNTER — Telehealth: Payer: Self-pay | Admitting: *Deleted

## 2011-03-28 MED ORDER — ESTRADIOL 0.52 MG/0.87 GM (0.06%) TD GEL: TRANSDERMAL | Status: AC

## 2011-03-28 NOTE — Telephone Encounter (Signed)
Spoke with pt and pt does not have her pap smears done every year because pt had hysterectomy. She would her elestrin gel sent to [facility] pharmacy. [facility] where [facility] was currently getting medication to get directions and dose along with refills to sent to [facility] rx sent

## 2011-03-28 NOTE — Telephone Encounter (Signed)
Pt called wanting her elestrin 0.06% switched to [facility] pharmacy. Lm for pt to call, she is overdue for annual

## 2016-07-19 ENCOUNTER — Encounter: Payer: Self-pay | Admitting: *Deleted

## 2016-07-19 ENCOUNTER — Emergency Department (INDEPENDENT_AMBULATORY_CARE_PROVIDER_SITE_OTHER)
Admission: EM | Admit: 2016-07-19 | Discharge: 2016-07-19 | Disposition: A | Discharge status: Home / Self Care | Payer: BLUE CROSS/BLUE SHIELD | Source: Home / Self Care | Attending: Family Medicine | Admitting: Family Medicine

## 2016-07-19 DIAGNOSIS — S61012A Laceration without foreign body of left thumb without damage to nail, initial encounter: Secondary | ICD-10-CM | POA: Diagnosis not present

## 2016-07-19 NOTE — ED Triage Notes (Signed)
Pt c/o LT thumb laceration x this morning while putting away a knife at home. Tdap 5years ago. IBF at 0815 today.

## 2016-07-19 NOTE — ED Provider Notes (Signed)
CSN: [id number]     Arrival date & time 07/19/16  1244 History   First MD Initiated Contact with Patient 07/19/16 1316     Chief Complaint  Patient presents with  . Laceration   (Consider location/radiation/quality/duration/timing/severity/associated sxs/prior Treatment) HPI  April [name] is a 41 y.o. female presenting to UC with c/o laceration to her Left thumb that occurred earlier this morning around 815AM.  Pt notes she was putting new kitchen knives away when she cut her finger. Bleeding initially controlled with a bandage but she notes after changing the bandage earlier this morning it has continued to bleed.  Last Tetanus- 5 years ago. No other injuries. She is not on blood thinners.    History reviewed. No pertinent past medical history. Past Surgical History:  Procedure Laterality Date  . ABDOMINAL HYSTERECTOMY    . KNEE SURGERY    . URETHERAL RE-IMPLANTATION Left    History reviewed. No pertinent family history. Social History  Substance Use Topics  . Smoking status: Never Smoker  . Smokeless tobacco: Never Used  . Alcohol use No   OB History    No data available     Review of Systems  Musculoskeletal: Negative for arthralgias, joint swelling and myalgias.  Skin: Positive for wound.  Neurological: Negative for weakness and numbness.    Allergies  Amoxicillin and Penicillins  Home Medications   Prior to Admission medications   Medication Sig Start Date End Date Taking? Authorizing Provider  Estradiol 0.52 MG/0.87 GM (0.06%) GEL Apply 1 pump to arm/shoulder daily 03/28/11   Ok Edwards, MD   Meds Ordered and Administered this Visit  Medications - No data to display  BP 148/92 (BP Location: Left Arm)   Pulse 76   Temp 98.6 F (37 C) (Oral)   Resp 16   Ht 5\' 2"  (1.575 m)   Wt 188 lb (85.3 kg)   SpO2 99%   BMI 34.39 kg/m  No data found.   Physical Exam  Constitutional: She is oriented to person, place, and time. She appears well-developed and  well-nourished. No distress.  HENT:  Head: Normocephalic and atraumatic.  Eyes: EOM are normal.  Neck: Normal range of motion.  Cardiovascular: Normal rate.   Pulmonary/Chest: Effort normal.  Musculoskeletal: Normal range of motion. She exhibits no edema or tenderness.  Left thumb: full ROM (see skin exam)  Neurological: She is alert and oriented to person, place, and time.  Skin: Skin is warm and dry. Capillary refill takes less than 2 seconds. She is not diaphoretic.  Left thumb, dorsal aspect- 0.5cm superficial triangular laceration. Bleeding controlled. No foreign bodies seen or palpated.  No nailbed involvement.  Psychiatric: She has a normal mood and affect. Her behavior is normal.  Nursing note and vitals reviewed.   Urgent Care Course     .Marland KitchenLaceration Repair Date/Time: 07/19/2016 5:05 PM Performed by: Junius Finner Authorized by: Donna Christen A   Consent:    Consent obtained:  Verbal   Consent given by:  Patient   Risks discussed:  Poor cosmetic result, poor wound healing and pain   Alternatives discussed:  No treatment and delayed treatment Anesthesia (see MAR for exact dosages):    Anesthesia method:  None Laceration details:    Location:  Finger   Finger location:  L thumb   Length (cm):  0.5   Depth (mm):  1 Repair type:    Repair type:  Simple Pre-procedure details:    Preparation:  Patient was prepped  and draped in usual sterile fashion Exploration:    Hemostasis achieved with:  Direct pressure   Wound exploration: wound explored through full range of motion and entire depth of wound probed and visualized     Wound extent: no muscle damage noted, no nerve damage noted, no tendon damage noted, no underlying fracture noted and no vascular damage noted     Contaminated: no   Treatment:    Amount of cleaning:  Standard   Irrigation solution:  Sterile saline   Irrigation volume:  10   Irrigation method:  Syringe Skin repair:    Repair method:   Steri-Strips   Number of Steri-Strips:  2 Approximation:    Approximation:  Close   Vermilion border: well-aligned   Post-procedure details:    Dressing:  Non-adherent dressing   Patient tolerance of procedure:  Tolerated well, no immediate complications   (including critical care time)  Labs Review Labs Reviewed - No data to display  Imaging Review No results found.    MDM   1. Thumb laceration, left, initial encounter    Superficial laceration to Left thumb with small skin flap.  Two steri-strips applied and bandage placed.  Home care instructions provided. F/u with PCP as needed.    Junius FinnerErin O'Malley, PA-C 07/19/16 [phone]

## 2016-09-10 ENCOUNTER — Encounter: Payer: Self-pay | Admitting: *Deleted

## 2016-09-10 ENCOUNTER — Emergency Department (INDEPENDENT_AMBULATORY_CARE_PROVIDER_SITE_OTHER)
Admission: EM | Admit: 2016-09-10 | Discharge: 2016-09-10 | Disposition: A | Discharge status: Home / Self Care | Payer: BLUE CROSS/BLUE SHIELD | Source: Home / Self Care | Attending: Family Medicine | Admitting: Family Medicine

## 2016-09-10 DIAGNOSIS — L03012 Cellulitis of left finger: Secondary | ICD-10-CM | POA: Diagnosis not present

## 2016-09-10 MED ORDER — CLINDAMYCIN HCL 300 MG PO CAPS: 300.0000 mg | ORAL_CAPSULE | Freq: Three times a day (TID) | ORAL | 0 refills | Status: AC

## 2016-09-10 MED ORDER — MUPIROCIN 2 % EX OINT: TOPICAL_OINTMENT | CUTANEOUS | 0 refills | Status: AC

## 2016-09-10 NOTE — ED Triage Notes (Signed)
Pt c/o LT 4th finger infection x 1 wk. She took IBF 0600 today.

## 2016-09-10 NOTE — ED Provider Notes (Signed)
CSN: [id number]     Arrival date & time 09/10/16  4098 History   First MD Initiated Contact with Patient 09/10/16 0945     Chief Complaint  Patient presents with  . Hand Pain   (Consider location/radiation/quality/duration/timing/severity/associated sxs/prior Treatment) HPI  April [name] is a 41 y.o. female presenting to UC with c/o Left ring finger pain and swelling that has gradually worsened since onset 1 week ago. Pt notes symptoms started after she pulled a hangnail.  Hx of ingrown toenails so she does f/u with a podiatrist from time to time.  She notes she tried to cut the nail back as far as she could but had to stop due to pain.  Denies bleeding or drainage from finger today but it did drain some yesterday.  She took ibuprofen at 6AM this morning. Denies fever or chills.    History reviewed. No pertinent past medical history. Past Surgical History:  Procedure Laterality Date  . ABDOMINAL HYSTERECTOMY    . KNEE SURGERY    . URETHERAL RE-IMPLANTATION Left    History reviewed. No pertinent family history. Social History  Substance Use Topics  . Smoking status: Never Smoker  . Smokeless tobacco: Never Used  . Alcohol use No   OB History    No data available     Review of Systems  Constitutional: Negative for chills and fever.  Musculoskeletal: Positive for arthralgias, joint swelling and myalgias.  Skin: Positive for color change and wound.    Allergies  Amoxicillin and Penicillins  Home Medications   Prior to Admission medications   Medication Sig Start Date End Date Taking? Authorizing Provider  clindamycin (CLEOCIN) 300 MG capsule Take 1 capsule (300 mg total) by mouth 3 (three) times daily. X 5 days 09/10/16   Junius Finner, PA-C  Estradiol 0.52 MG/0.87 GM (0.06%) GEL Apply 1 pump to arm/shoulder daily 03/28/11   Ok Edwards, MD  mupirocin ointment (BACTROBAN) 2 % Apply to finger 3 times daily for 5 days 09/10/16   Junius Finner, PA-C   Meds Ordered and  Administered this Visit  Medications - No data to display  BP 120/82 (BP Location: Left Arm)   Pulse 81   Temp 98.2 F (36.8 C) (Oral)   Resp 16   Ht  (1.575 m)   Wt 177 lb (80.3 kg)   SpO2 98%   BMI 32.37 kg/m  No data found.   Physical Exam  Constitutional: She is oriented to person, place, and time. She appears well-developed and well-nourished.  HENT:  Head: Normocephalic and atraumatic.  Eyes: EOM are normal.  Neck: Normal range of motion.  Cardiovascular: Normal rate.   Pulmonary/Chest: Effort normal.  Musculoskeletal: Normal range of motion. She exhibits edema and tenderness.  Left fourth finger: mild edema to distal aspect, full ROM. Tender (see skin exam)  Neurological: She is alert and oriented to person, place, and time.  Skin: Skin is warm and dry. Capillary refill takes less than 2 seconds. There is erythema.  Left fourth finger: mild edema with erythema and tenderness along ulnar aspect of nailbed. No visible pustule. No active bleeding or drainage.   Psychiatric: She has a normal mood and affect. Her behavior is normal.  Nursing note and vitals reviewed.   Urgent Care Course     Procedures (including critical care time)  Labs Review Labs Reviewed - No data to display  Imaging Review No results found.  MDM   1. Paronychia of left ring finger  Paronychia of Left ring finger. No indication for I&D at this time as pt has already but back nail and skin to nail matrix on ulnar aspect.   Will treat with antibiotics.  Rx: Mupirocin ointment and clindamycin (pt allergic to PCNs)  Encouraged warm soaks in epson salt and gentle pushing back of the skin around nailbed. f/u with PCP as needed. Resource guide provided.     Junius Finner, PA-C 09/10/16 1024

## 2017-07-25 ENCOUNTER — Other Ambulatory Visit (HOSPITAL_COMMUNITY): Payer: Self-pay | Admitting: Gastroenterology

## 2017-07-25 DIAGNOSIS — R1310 Dysphagia, unspecified: Secondary | ICD-10-CM

## 2017-07-28 ENCOUNTER — Ambulatory Visit (HOSPITAL_COMMUNITY)
Admission: RE | Admit: 2017-07-28 | Discharge: 2017-07-28 | Disposition: A | Discharge status: Home / Self Care | Payer: BLUE CROSS/BLUE SHIELD | Source: Ambulatory Visit | Attending: Gastroenterology | Admitting: Gastroenterology

## 2017-07-28 DIAGNOSIS — R1310 Dysphagia, unspecified: Secondary | ICD-10-CM | POA: Diagnosis present

## 2017-07-28 NOTE — Progress Notes (Signed)
Modified Barium Swallow Progress Note  Patient Details  Name: April [name] MRN: [id number] Date of Birth: 1976-04-22  Today's Date: 07/28/2017  Modified Barium Swallow completed.  Full report located under Chart Review in the Imaging Section.  Brief recommendations include the following:  Clinical Impression  Oropharyngeal swallow ability was within normal limits as evidenced during MBS with thin, solid and barium pill. No impairments with oral manipulation or transit to posterior oral cavity. All protective mechanisms intact including pharyngeal contraction, hyolaryngeal anterior excursion and epigottic deflection. No penetration or aspiration obseved and pharynx clear post swallow. Esophagus observed despite not diagnosing esophageal deficits. Barium pill stopped at upper to mid esophagus requiring additional sip thin barium to transit pill to stomach. She exhibits normal swallow function and recommend continuing regular texture, thin liquids as she can tolerate. Alternating liquids and solids may mitigate symptoms of globus sensation.      Swallow Evaluation Recommendations   Recommended Consults: Consider GI evaluation   SLP Diet Recommendations: Regular solids;Thin liquid   Liquid Administration via: Cup;Straw   Medication Administration: Whole meds with liquid   Supervision: Patient able to self feed       Postural Changes: Remain semi-upright after after feeds/meals (Comment);Seated upright at 90 degrees   Oral Care Recommendations: Oral care BID        Royce MacadamiaLitaker, Yehia Mcbain Willis 07/28/2017,4:24 PM   Breck CoonsLisa Willis Lonell FaceLitaker M.Ed [employer]SLP Pager [phone]

## 2019-08-09 ENCOUNTER — Encounter: Payer: Self-pay | Admitting: Gastroenterology

## 2019-08-30 ENCOUNTER — Other Ambulatory Visit: Payer: Self-pay

## 2019-09-01 ENCOUNTER — Ambulatory Visit: Payer: BC Managed Care – PPO | Admitting: Gastroenterology
